# Patient Record
Sex: Male | Born: 1950
Health system: Southern US, Community
[De-identification: ages and names within clinical notes are randomized; demographics above are authoritative.]

## PROBLEM LIST (undated history)

## (undated) DIAGNOSIS — I714 Abdominal aortic aneurysm, without rupture, unspecified: Secondary | ICD-10-CM

## (undated) DIAGNOSIS — I10 Essential (primary) hypertension: Secondary | ICD-10-CM

## (undated) DIAGNOSIS — E785 Hyperlipidemia, unspecified: Secondary | ICD-10-CM

## (undated) DIAGNOSIS — M51369 Other intervertebral disc degeneration, lumbar region without mention of lumbar back pain or lower extremity pain: Secondary | ICD-10-CM

## (undated) DIAGNOSIS — M5136 Other intervertebral disc degeneration, lumbar region: Secondary | ICD-10-CM

## (undated) HISTORY — DX: Other intervertebral disc degeneration, lumbar region without mention of lumbar back pain or lower extremity pain: M51.369

## (undated) HISTORY — DX: Essential (primary) hypertension: I10

## (undated) HISTORY — DX: Abdominal aortic aneurysm, without rupture, unspecified: I71.40

## (undated) HISTORY — DX: Hyperlipidemia, unspecified: E78.5

## (undated) HISTORY — DX: Other intervertebral disc degeneration, lumbar region: M51.36

## (undated) HISTORY — PX: DESCENDING AORTIC ANEURYSM REPAIR W/ STENT: SHX1456

## (undated) HISTORY — DX: Abdominal aortic aneurysm, without rupture: I71.4

---

## 2018-07-21 DIAGNOSIS — M5136 Other intervertebral disc degeneration, lumbar region: Secondary | ICD-10-CM | POA: Insufficient documentation

## 2018-07-21 DIAGNOSIS — M51369 Other intervertebral disc degeneration, lumbar region without mention of lumbar back pain or lower extremity pain: Secondary | ICD-10-CM | POA: Insufficient documentation

## 2018-07-21 DIAGNOSIS — N183 Chronic kidney disease, stage 3 unspecified: Secondary | ICD-10-CM | POA: Insufficient documentation

## 2018-07-21 DIAGNOSIS — Z9889 Other specified postprocedural states: Secondary | ICD-10-CM | POA: Insufficient documentation

## 2018-07-28 ENCOUNTER — Encounter (INDEPENDENT_AMBULATORY_CARE_PROVIDER_SITE_OTHER): Payer: Self-pay | Admitting: Vascular Surgery

## 2018-07-28 ENCOUNTER — Ambulatory Visit (INDEPENDENT_AMBULATORY_CARE_PROVIDER_SITE_OTHER): Payer: Medicare Other | Admitting: Vascular Surgery

## 2018-07-28 ENCOUNTER — Other Ambulatory Visit: Payer: Self-pay

## 2018-07-28 VITALS — BP 125/77 | HR 57 | Resp 10 | Ht 73.0 in | Wt 221.0 lb

## 2018-07-28 DIAGNOSIS — I10 Essential (primary) hypertension: Secondary | ICD-10-CM | POA: Diagnosis not present

## 2018-07-28 DIAGNOSIS — E785 Hyperlipidemia, unspecified: Secondary | ICD-10-CM | POA: Diagnosis not present

## 2018-07-28 DIAGNOSIS — I714 Abdominal aortic aneurysm, without rupture, unspecified: Secondary | ICD-10-CM

## 2018-07-28 DIAGNOSIS — I724 Aneurysm of artery of lower extremity: Secondary | ICD-10-CM | POA: Diagnosis not present

## 2018-07-28 DIAGNOSIS — Z87891 Personal history of nicotine dependence: Secondary | ICD-10-CM

## 2018-07-28 DIAGNOSIS — Z79899 Other long term (current) drug therapy: Secondary | ICD-10-CM

## 2018-07-28 NOTE — Assessment & Plan Note (Signed)
The patient is now 3 years status post an endovascular repair of his abdominal aortic aneurysm.  This has been followed on 36-month intervals so there may have been some sort of endoleak or other issue going on.  He is due for his next check and we will get this scheduled in the next couple of weeks at his convenience.

## 2018-07-28 NOTE — Assessment & Plan Note (Signed)
lipid control important in reducing the progression of atherosclerotic disease. Continue statin therapy  

## 2018-07-28 NOTE — Patient Instructions (Signed)
Abdominal Aortic Aneurysm    An aneurysm is a bulge in one of the blood vessels that carry blood away from the heart (artery). It happens when blood pushes up against a weak or damaged place in the wall of an artery. An abdominal aortic aneurysm happens in the main artery of the body (aorta).  Some aneurysms may not cause problems. If it grows, it can burst or tear, causing bleeding inside the body. This is an emergency. It needs to be treated right away.  What are the causes?  The exact cause of this condition is not known.  What increases the risk?  The following may make you more likely to get this condition:   Being a male who is 60 years of age or older.   Being white (Caucasian).   Using tobacco.   Having a family history of aneurysms.   Having the following conditions:  ? Hardening of the arteries (arteriosclerosis).  ? Inflammation of the walls of an artery (arteritis).  ? Certain genetic conditions.  ? Being very overweight (obesity).  ? An infection in the wall of the aorta (infectious aortitis).  ? High cholesterol.  ? High blood pressure (hypertension).  What are the signs or symptoms?  Symptoms depend on the size of the aneurysm and how fast it is growing. Most grow slowly and do not cause any symptoms. If symptoms do occur, they may include:   Pain in the belly (abdomen), side, or back.   Feeling full after eating only small amounts of food.   Feeling a throbbing lump in the belly.  Symptoms that the aneurysm has burst (ruptured) include:   Sudden, very bad pain in the belly, side, or back.   Feeling sick to your stomach (nauseous).   Throwing up (vomiting).   Feeling light-headed or passing out.  How is this treated?  Treatment for this condition depends on:   The size of the aneurysm.   How fast it is growing.   Your age.   Your risk of having it burst.  If your aneurysm is smaller than 2 inches (5 cm), your doctor may manage it by:   Checking it often to see if it is getting bigger.  You may have an imaging test (ultrasound) to check it every 3-6 months, every year, or every few years.   Giving you medicines to:  ? Control blood pressure.  ? Treat pain.  ? Fight infection.  If your aneurysm is larger than 2 inches (5 cm), you may need surgery to fix it.  Follow these instructions at home:  Lifestyle   Do not use any products that have nicotine or tobacco in them. This includes cigarettes, e-cigarettes, and chewing tobacco. If you need help quitting, ask your doctor.   Get regular exercise. Ask your doctor what types of exercise are best for you.  Eating and drinking   Eat a heart-healthy diet. This includes eating plenty of:  ? Fresh fruits and vegetables.  ? Whole grains.  ? Low-fat (lean) protein.  ? Low-fat dairy products.   Avoid foods that are high in saturated fat and cholesterol. These foods include red meat and some dairy products.   Do not drink alcohol if:  ? Your doctor tells you not to drink.  ? You are pregnant, may be pregnant, or are planning to become pregnant.   If you drink alcohol:  ? Limit how much you use to:   0-1 drink a day for women.     0-2 drinks a day for men.  ? Be aware of how much alcohol is in your drink. In the [place] one drink equals any of these:   One typical bottle of beer (12 oz).   One-half glass of wine (5 oz).   One shot of hard liquor (1 oz).  General instructions   Take over-the-counter and prescription medicines only as told by your doctor.   Keep your blood pressure within normal limits. Ask your doctor what your blood pressure should be.   Have your blood sugar (glucose) level and cholesterol levels checked regularly. Keep your blood sugar level and cholesterol levels within normal limits.   Avoid heavy lifting and activities that take a lot of effort. Ask your doctor what activities are safe for you.   Keep all follow-up visits as told by your doctor. This is important.  ? Talk to your doctor about regular screenings to see if the  aneurysm is getting bigger.  Contact a doctor if you:   Have pain in your belly, side, or back.   Have a throbbing feeling in your belly.   Have a family history of aneurysms.  Get help right away if you:   Have sudden, bad pain in your belly, side, or back.   Feel sick to your stomach.   Throw up.   Have trouble pooping (constipation).   Have trouble peeing (urinating).   Feel light-headed.   Have a fast heart rate when you stand.   Have sweaty skin that is cold to the touch (clammy).   Have shortness of breath.   Have a fever.  These symptoms may be an emergency. Do not wait to see if the symptoms will go away. Get medical help right away. Call your local emergency services (911 in the [place] Do not drive yourself to the hospital.  Summary   An aneurysm is a bulge in one of the blood vessels that carry blood away from the heart (artery). Some aneurysms may not cause problems.   You may need to have yours checked often. If it grows, it can burst or tear. This causes bleeding inside the body. It needs to be treated right away.   Follow instructions from your doctor about healthy lifestyle changes.   Keep all follow-up visits as told by your doctor. This is important.  This information is not intended to replace advice given to you by your health care provider. Make sure you discuss any questions you have with your health care provider.  Document Released: 07/06/2012 Document Revised: 10/18/2017 Document Reviewed: 10/18/2017  Elsevier Interactive Patient Education  2019 [employer]

## 2018-07-28 NOTE — Assessment & Plan Note (Signed)
blood pressure control important in reducing the progression of atherosclerotic disease. On appropriate oral medications.  

## 2018-07-28 NOTE — Assessment & Plan Note (Signed)
He has an asymptomatic right popliteal aneurysm that was found with a meniscus tear last year.  He does not know the size and I do not have the images or report of the ultrasound to review.  He says he is due to have this checked as it was last checked about 6 months ago.  This will be scheduled in the next couple of weeks at his convenience.  I will see the patient back following this to discuss the results and determine further treatment options.

## 2018-07-28 NOTE — Progress Notes (Signed)
Patient ID: Jeffrey Salas, male   DOB: [birth year], 68 y.o.   MRN: [id number]  Chief Complaint  Patient presents with  . New Patient (Initial Visit)    HPI Jeffrey Salas is a 68 y.o. male.  I am asked to see the patient by Dr. Sabra Heck for evaluation of abdominal aortic aneurysm and popliteal aneurysm.  The patient is 3 years status post endovascular abdominal aortic aneurysm repair at another institution.  He said this went great and he is done well without any aneurysm related symptoms following repair.  He has no current back or abdominal pain.  No signs of peripheral embolization.  This was last checked about 6 months ago and he says they have been checking this on 78-month intervals.  Not clear if there has been any endoleak or other issue. The patient also reports being diagnosed with a popliteal aneurysm last fall.  This was found incidentally during a work-up for a meniscus tear.  He says he has some occasional right knee pain.  No significant lower extremity swelling.  No ulceration or infection.     Past Medical History:  Diagnosis Date  . AAA (abdominal aortic aneurysm) ([place])   . Degenerative disc disease, lumbar   . Hyperlipidemia   . Hypertension    Past Surgical History Abdominal aortic aneurysm repair  Family History No bleeding disorders, clotting disorders, autoimmune diseases or aneurysms  Social History Social History   Tobacco Use  . Smoking status: Former Smoker    Types: Cigars    Last attempt to quit: 07/28/2015    Years since quitting: 3.0  . Smokeless tobacco: Never Used  Substance Use Topics  . Alcohol use: Yes    Alcohol/week: 3.0 standard drinks    Types: 3 Glasses of wine per week  . Drug use: Not Currently     No Known Allergies  Current Outpatient Medications  Medication Sig Dispense Refill  . aspirin 81 MG chewable tablet Chew 81 mg by mouth daily.    Marland Kitchen atorvastatin (LIPITOR) 20 MG tablet Take 20 mg by mouth daily.    . carvedilol (COREG)  25 MG tablet Take 25 mg by mouth 2 (two) times daily with a meal.    . hydrALAZINE (APRESOLINE) 10 MG tablet Take 20 mg by mouth 3 (three) times daily.    Marland Kitchen olmesartan (BENICAR) 20 MG tablet Take 20 mg by mouth daily.     No current facility-administered medications for this visit.       REVIEW OF SYSTEMS (Negative unless checked)  Constitutional: [] Weight loss  [] Fever  [] Chills Cardiac: [] Chest pain   [] Chest pressure   [] Palpitations   [] Shortness of breath when laying flat   [] Shortness of breath at rest   [] Shortness of breath with exertion. Vascular:  [] Pain in legs with walking   [] Pain in legs at rest   [] Pain in legs when laying flat   [] Claudication   [] Pain in feet when walking  [] Pain in feet at rest  [] Pain in feet when laying flat   [] History of DVT   [] Phlebitis   [] Swelling in legs   [] Varicose veins   [] Non-healing ulcers Pulmonary:   [] Uses home oxygen   [] Productive cough   [] Hemoptysis   [] Wheeze  [] COPD   [] Asthma Neurologic:  [] Dizziness  [] Blackouts   [] Seizures   [] History of stroke   [] History of TIA  [] Aphasia   [] Temporary blindness   [] Dysphagia   [] Weakness or numbness in arms   [] Weakness  or numbness in legs Musculoskeletal:  [x] Arthritis   [] Joint swelling   [x] Joint pain   [x] Low back pain Hematologic:  [] Easy bruising  [] Easy bleeding   [] Hypercoagulable state   [] Anemic  [] Hepatitis Gastrointestinal:  [] Blood in stool   [] Vomiting blood  [] Gastroesophageal reflux/heartburn   [] Abdominal pain Genitourinary:  [] Chronic kidney disease   [] Difficult urination  [] Frequent urination  [] Burning with urination   [] Hematuria Skin:  [] Rashes   [] Ulcers   [] Wounds Psychological:  [] History of anxiety   []  History of major depression.    Physical Exam BP 125/77 (BP Location: Left Arm, Patient Position: Sitting, Cuff Size: Large)   Pulse (!) 57   Resp 10   Ht 6\' 1"  (1.854 m)   Wt 221 lb (100.2 kg)   BMI 29.16 kg/m  Gen:  WD/WN, NAD.  Appears younger than stated  age Head: [place]/AT, No temporalis wasting. Ear/Nose/Throat: Hearing grossly intact, nares w/o erythema or drainage, oropharynx w/o Erythema/Exudate Eyes: Conjunctiva clear, sclera non-icteric  Neck: trachea midline.  No JVD.  Pulmonary:  Good air movement, respirations not labored, no use of accessory muscles  Cardiac: RRR, no JVD Vascular:  Vessel Right Left  Radial Palpable Palpable                          DP  2+  2+  PT  2+  1+   Gastrointestinal:. No masses, surgical incisions, or scars. Musculoskeletal: M/S 5/5 throughout.  Extremities without ischemic changes.  No deformity or atrophy.  No appreciable edema. Neurologic: Sensation grossly intact in extremities.  Symmetrical.  Speech is fluent. Motor exam as listed above. Psychiatric: Judgment intact, Mood & affect appropriate for pt's clinical situation. Dermatologic: No rashes or ulcers noted.  No cellulitis or open wounds.    Radiology No results found.  Labs No results found for this or any previous visit (from the past 2160 hour(s)).  Assessment/Plan:  Essential hypertension blood pressure control important in reducing the progression of atherosclerotic disease. On appropriate oral medications.   Hyperlipidemia lipid control important in reducing the progression of atherosclerotic disease. Continue statin therapy   AAA (abdominal aortic aneurysm) without rupture ([place]) The patient is now 3 years status post an endovascular repair of his abdominal aortic aneurysm.  This has been followed on 36-month intervals so there may have been some sort of endoleak or other issue going on.  He is due for his next check and we will get this scheduled in the next couple of weeks at his convenience.  Popliteal artery aneurysm [facility]) He has an asymptomatic right popliteal aneurysm that was found with a meniscus tear last year.  He does not know the size and I do not have the images or report of the ultrasound to review.  He says he  is due to have this checked as it was last checked about 6 months ago.  This will be scheduled in the next couple of weeks at his convenience.  I will see the patient back following this to discuss the results and determine further treatment options.      Leotis Pain 07/28/2018, 9:30 AM   This note was created with Dragon medical transcription system.  Any errors from dictation are unintentional.

## 2018-08-11 ENCOUNTER — Encounter (INDEPENDENT_AMBULATORY_CARE_PROVIDER_SITE_OTHER): Payer: Self-pay | Admitting: Vascular Surgery

## 2018-08-11 ENCOUNTER — Ambulatory Visit (INDEPENDENT_AMBULATORY_CARE_PROVIDER_SITE_OTHER): Payer: Medicare Other

## 2018-08-11 ENCOUNTER — Other Ambulatory Visit: Payer: Self-pay

## 2018-08-11 ENCOUNTER — Ambulatory Visit (INDEPENDENT_AMBULATORY_CARE_PROVIDER_SITE_OTHER): Payer: Medicare Other | Admitting: Vascular Surgery

## 2018-08-11 VITALS — BP 115/75 | HR 55 | Resp 10 | Ht 72.5 in | Wt 220.0 lb

## 2018-08-11 DIAGNOSIS — I714 Abdominal aortic aneurysm, without rupture, unspecified: Secondary | ICD-10-CM

## 2018-08-11 DIAGNOSIS — I724 Aneurysm of artery of lower extremity: Secondary | ICD-10-CM | POA: Diagnosis not present

## 2018-08-11 DIAGNOSIS — I10 Essential (primary) hypertension: Secondary | ICD-10-CM | POA: Diagnosis not present

## 2018-08-11 DIAGNOSIS — E785 Hyperlipidemia, unspecified: Secondary | ICD-10-CM | POA: Diagnosis not present

## 2018-08-11 DIAGNOSIS — Z79899 Other long term (current) drug therapy: Secondary | ICD-10-CM

## 2018-08-11 DIAGNOSIS — Z87891 Personal history of nicotine dependence: Secondary | ICD-10-CM

## 2018-08-11 NOTE — Progress Notes (Signed)
MRN : [id number]  Jeffrey Salas is a 68 y.o. (1950/06/19) male who presents with chief complaint of  Chief Complaint  Patient presents with  . Follow-up  .  History of Present Illness: Patient returns today in follow up of his AAA and popliteal aneurysms.  He is about 3 years status post endovascular repair of his aneurysm at another institution.  He has no current aneurysm related symptoms. Specifically, the patient denies new back or abdominal pain, or signs of peripheral embolization.  Duplex today shows a patent stent graft with no evidence of endoleak.  The aneurysm sac size is 5.5 cm in maximal diameter. He is also followed today for his popliteal aneurysm.  Occasional right leg pain but nothing severe.  No signs of peripheral embolization. His right popliteal aneurysm measures 1.9 cm in maximal diameter today.  Previously, it was 1.8 cm last year.  His left popliteal artery is generous at 0.85 cm but not technically aneurysmal.       Past Medical History:  Diagnosis Date  . AAA (abdominal aortic aneurysm) ([place])   . Degenerative disc disease, lumbar   . Hyperlipidemia   . Hypertension    Past Surgical History Abdominal aortic aneurysm repair  Family History No bleeding disorders, clotting disorders, autoimmune diseases or aneurysms  Social History Social History        Tobacco Use  . Smoking status: Former Smoker    Types: Cigars    Last attempt to quit: 07/28/2015    Years since quitting: 3.0  . Smokeless tobacco: Never Used  Substance Use Topics  . Alcohol use: Yes    Alcohol/week: 3.0 standard drinks    Types: 3 Glasses of wine per week  . Drug use: Not Currently     No Known Allergies        Current Outpatient Medications  Medication Sig Dispense Refill  . aspirin 81 MG chewable tablet Chew 81 mg by mouth daily.    Marland Kitchen atorvastatin (LIPITOR) 20 MG tablet Take 20 mg by mouth daily.    . carvedilol (COREG) 25 MG tablet Take 25 mg by  mouth 2 (two) times daily with a meal.    . hydrALAZINE (APRESOLINE) 10 MG tablet Take 20 mg by mouth 3 (three) times daily.    Marland Kitchen olmesartan (BENICAR) 20 MG tablet Take 20 mg by mouth daily.     No current facility-administered medications for this visit.       REVIEW OF SYSTEMS (Negative unless checked)  Constitutional: [] ?Weight loss  [] ?Fever  [] ?Chills Cardiac: [] ?Chest pain   [] ?Chest pressure   [] ?Palpitations   [] ?Shortness of breath when laying flat   [] ?Shortness of breath at rest   [] ?Shortness of breath with exertion. Vascular:  [] ?Pain in legs with walking   [] ?Pain in legs at rest   [] ?Pain in legs when laying flat   [] ?Claudication   [] ?Pain in feet when walking  [] ?Pain in feet at rest  [] ?Pain in feet when laying flat   [] ?History of DVT   [] ?Phlebitis   [] ?Swelling in legs   [] ?Varicose veins   [] ?Non-healing ulcers Pulmonary:   [] ?Uses home oxygen   [] ?Productive cough   [] ?Hemoptysis   [] ?Wheeze  [] ?COPD   [] ?Asthma Neurologic:  [] ?Dizziness  [] ?Blackouts   [] ?Seizures   [] ?History of stroke   [] ?History of TIA  [] ?Aphasia   [] ?Temporary blindness   [] ?Dysphagia   [] ?Weakness or numbness in arms   [] ?Weakness or numbness in legs Musculoskeletal:  [x] ?Arthritis   [] ?  Joint swelling   [x] ?Joint pain   [x] ?Low back pain Hematologic:  [] ?Easy bruising  [] ?Easy bleeding   [] ?Hypercoagulable state   [] ?Anemic  [] ?Hepatitis Gastrointestinal:  [] ?Blood in stool   [] ?Vomiting blood  [] ?Gastroesophageal reflux/heartburn   [] ?Abdominal pain Genitourinary:  [] ?Chronic kidney disease   [] ?Difficult urination  [] ?Frequent urination  [] ?Burning with urination   [] ?Hematuria Skin:  [] ?Rashes   [] ?Ulcers   [] ?Wounds Psychological:  [] ?History of anxiety   [] ? History of major depression.    Physical Examination  BP 115/75 (BP Location: Left Arm, Patient Position: Sitting, Cuff Size: Large)   Pulse (!) 55   Resp 10   Ht 6' 0.5" (1.842 m)   Wt 220 lb (99.8 kg)   BMI 29.43  kg/m  Gen:  WD/WN, NAD Head: [place]/AT, No temporalis wasting. Ear/Nose/Throat: Hearing grossly intact, nares w/o erythema or drainage Eyes: Conjunctiva clear. Sclera non-icteric Neck: Supple.  Trachea midline Pulmonary:  Good air movement, no use of accessory muscles.  Cardiac: RRR, no JVD Vascular:  Vessel Right Left  Radial Palpable Palpable                      Popliteal  enlarged  enlarged  PT Palpable Palpable  DP Palpable Palpable   Gastrointestinal: soft, non-tender/non-distended. No guarding/reflex.  Musculoskeletal: M/S 5/5 throughout.  No deformity or atrophy.  Neurologic: Sensation grossly intact in extremities.  Symmetrical.  Speech is fluent.  Psychiatric: Judgment intact, Mood & affect appropriate for pt's clinical situation. Dermatologic: No rashes or ulcers noted.  No cellulitis or open wounds.       Labs No results found for this or any previous visit (from the past 2160 hour(s)).  Radiology No results found.  Assessment/Plan Essential hypertension blood pressure control important in reducing the progression of atherosclerotic disease. On appropriate oral medications.   Hyperlipidemia lipid control important in reducing the progression of atherosclerotic disease. Continue statin therapy  AAA (abdominal aortic aneurysm) without rupture ([facility]) Duplex today shows a patent stent graft with no evidence of endoleak.  The aneurysm sac size is 5.5 cm in maximal diameter. At this point, with no endoleak I think going to an annual follow-up would be reasonable.  Popliteal artery aneurysm ([facility]) His right popliteal aneurysm measures 1.9 cm in maximal diameter today.  Previously, it was 1.8 cm last year.  His left popliteal artery is generous at 0.85 cm but not technically aneurysmal.  I think shortening his follow-up to 6 months at this point would be reasonable.  We discussed that aneurysms over 2 cm have a higher risk of thrombosis and that is usually where we  consider repair.    Leotis Pain, MD  08/11/2018 9:45 AM    This note was created with Dragon medical transcription system.  Any errors from dictation are purely unintentional

## 2018-08-11 NOTE — Assessment & Plan Note (Signed)
Duplex today shows a patent stent graft with no evidence of endoleak.  The aneurysm sac size is 5.5 cm in maximal diameter. At this point, with no endoleak I think going to an annual follow-up would be reasonable.

## 2018-08-11 NOTE — Patient Instructions (Signed)
Abdominal Aortic Aneurysm  An aneurysm is a bulge in an artery. It happens when blood pushes against a weakened or damaged artery wall. An abdominal aortic aneurysm (AAA) is an aneurysm that occurs in the lower part of the aorta, which is the main artery of the body. The aorta supplies blood from the heart to the rest of the body. Some aneurysms may not cause symptoms. However, an AAA can cause two serious problems:  It can enlarge and burst (rupture).  It can cause blood to flow between the layers of the wall of the aorta through a tear (aortic dissection). Both of these problems are medical emergencies. They can cause bleeding inside the body. If they are not diagnosed and treated right away, they can be life-threatening. What are the causes? The exact cause of this condition is not known. What increases the risk? The following factors may make you more likely to develop this condition:  Being a male 60 years of age or older.  Being Caucasian.  Using tobacco or having a history of tobacco use.  Having a family history of aneurysms.  Having any of the following conditions: ? Hardening of the arteries (arteriosclerosis). ? Inflammation of the walls of an artery (arteritis). ? Certain genetic conditions. ? Obesity. ? An infection in the wall of the aorta (infectious aortitis) caused by bacteria. ? High cholesterol. ? High blood pressure (hypertension). What are the signs or symptoms? Symptoms of this condition vary depending on the size of the aneurysm and how fast it is growing. Most aneurysms grow slowly and do not cause any symptoms. When symptoms do occur, they may include:  Pain in the abdomen, side, or lower back.  Feeling full after eating only small amounts of food.  Feeling a pulsating lump in the abdomen. Symptoms that the aneurysm has ruptured include:  A sudden onset of severe pain in the abdomen, side, or back.  Nausea or vomiting.  Feeling light-headed or  passing out. How is this diagnosed? This condition may be diagnosed with:  A physical exam to check for throbbing and to listen to blood flow in your abdomen.  Tests, such as: ? Ultrasound. ? X-rays. ? CT scan. ? MRI. ? Tests to check your arteries for damage or blockage (angiogram). Because most unruptured AAAs cause no symptoms, they are often found during exams for other conditions. How is this treated? Treatment for this condition depends on:  The size of the aneurysm.  How fast the aneurysm is growing.  Your age.  Risk factors for rupture. If your aneurysm is smaller than 2 inches (5 cm), your health care provider may manage it by:  Checking (monitoring) it regularly to see if it is getting bigger. Depending on the size of the aneurysm, how fast it is growing, and your other risk factors, you may have an ultrasound to monitor it every 3-6 months, every year, or every few years.  Giving you medicines to control blood pressure, treat pain, or fight infection. If your aneurysm is larger than 2 inches (5 cm), your health care provider may do surgery to repair it. Follow these instructions at home: Lifestyle  Do not use any products that contain nicotine or tobacco, such as cigarettes, e-cigarettes, and chewing tobacco. If you need help quitting, ask your health care provider.  Stay physically active and exercise regularly. Talk with your health care provider about how often you should exercise and which types of exercise are safe for you. Eating and drinking    Eat a heart-healthy diet. This includes plenty of fresh fruits and vegetables, whole grains, low-fat (lean) protein, and low-fat dairy products.  Avoid foods that are high in saturated fat and cholesterol, such as red meat and some dairy products.  Do not drink alcohol if: ? Your health care provider tells you not to drink. ? You are pregnant, may be pregnant, or are planning to become pregnant.  If you drink  alcohol: ? Limit how much you use to:  0-1 drink a day for women.  0-2 drinks a day for men. ? Be aware of how much alcohol is in your drink. In the [place] one drink equals one typical bottle of beer (12 oz), one-half glass of wine (5 oz), or one shot of hard liquor (1 oz). General instructions  Take over-the-counter and prescription medicines only as told by your health care provider.  Keep your blood pressure within a normal range. Check it regularly, and ask your health care provider what your target blood pressure should be.  Have your blood sugar (glucose) level and cholesterol levels checked regularly. Follow your health care provider's instructions on how to keep levels within normal limits.  Avoid heavy lifting and activities that take a lot of effort (are strenuous). Ask your health care provider what activities are safe for you.  Keep all follow-up visits as told by your health care provider. This is important. Contact a health care provider if you:  Have pain in your abdomen, side, or back.  Have a throbbing feeling in your abdomen. Get help right away if you:  Have sudden, severe pain in your abdomen, side, or back.  Experience nausea or vomiting.  Have constipation or problems urinating.  Feel light-headed.  Have a rapid heart rate when you stand.  Have sweaty, clammy skin.  Have shortness of breath.  Have a fever. These symptoms may represent a serious problem that is an emergency. Do not wait to see if the symptoms will go away. Get medical help right away. Call your local emergency services (911 in the [place] Do not drive yourself to the hospital. Summary  An aneurysm is a bulge in an artery. It happens when blood pushes against a weakened or damaged artery wall.  Being older, male, Caucasian, having a history of tobacco use, and a family history of aneurysms can increase the risk.  These problems can cause bleeding inside the body and can be  life-threatening. Get medical help right away. This information is not intended to replace advice given to you by your health care provider. Make sure you discuss any questions you have with your health care provider. Document Released: 12/19/2004 Document Revised: 10/18/2017 Document Reviewed: 10/18/2017 Elsevier Interactive Patient Education  2019 [employer].

## 2018-08-11 NOTE — Assessment & Plan Note (Signed)
His right popliteal aneurysm measures 1.9 cm in maximal diameter today.  Previously, it was 1.8 cm last year.  His left popliteal artery is generous at 0.85 cm but not technically aneurysmal.  I think shortening his follow-up to 6 months at this point would be reasonable.  We discussed that aneurysms over 2 cm have a higher risk of thrombosis and that is usually where we consider repair.

## 2018-10-23 ENCOUNTER — Other Ambulatory Visit: Admission: RE | Admit: 2018-10-23 | Payer: Medicare Other | Source: Ambulatory Visit

## 2018-10-28 ENCOUNTER — Ambulatory Visit: Admit: 2018-10-28 | Payer: Medicare Other | Admitting: Internal Medicine

## 2018-10-28 SURGERY — COLONOSCOPY WITH PROPOFOL
Anesthesia: General

## 2019-01-18 DIAGNOSIS — Z0000 Encounter for general adult medical examination without abnormal findings: Secondary | ICD-10-CM | POA: Insufficient documentation

## 2019-02-12 ENCOUNTER — Encounter (INDEPENDENT_AMBULATORY_CARE_PROVIDER_SITE_OTHER): Payer: Medicare Other

## 2019-02-12 ENCOUNTER — Ambulatory Visit (INDEPENDENT_AMBULATORY_CARE_PROVIDER_SITE_OTHER): Payer: Medicare Other | Admitting: Vascular Surgery

## 2019-02-26 ENCOUNTER — Encounter (INDEPENDENT_AMBULATORY_CARE_PROVIDER_SITE_OTHER): Payer: Self-pay | Admitting: Vascular Surgery

## 2019-02-26 ENCOUNTER — Other Ambulatory Visit: Payer: Self-pay

## 2019-02-26 ENCOUNTER — Encounter (INDEPENDENT_AMBULATORY_CARE_PROVIDER_SITE_OTHER): Payer: Self-pay

## 2019-02-26 ENCOUNTER — Ambulatory Visit (INDEPENDENT_AMBULATORY_CARE_PROVIDER_SITE_OTHER): Payer: Medicare Other | Admitting: Vascular Surgery

## 2019-02-26 ENCOUNTER — Ambulatory Visit (INDEPENDENT_AMBULATORY_CARE_PROVIDER_SITE_OTHER): Payer: Medicare Other

## 2019-02-26 VITALS — Resp 16 | Wt 218.0 lb

## 2019-02-26 DIAGNOSIS — I714 Abdominal aortic aneurysm, without rupture, unspecified: Secondary | ICD-10-CM

## 2019-02-26 DIAGNOSIS — I724 Aneurysm of artery of lower extremity: Secondary | ICD-10-CM

## 2019-02-26 DIAGNOSIS — I10 Essential (primary) hypertension: Secondary | ICD-10-CM | POA: Diagnosis not present

## 2019-02-26 DIAGNOSIS — E785 Hyperlipidemia, unspecified: Secondary | ICD-10-CM

## 2019-02-26 DIAGNOSIS — N183 Chronic kidney disease, stage 3 unspecified: Secondary | ICD-10-CM

## 2019-02-26 NOTE — Progress Notes (Signed)
MRN : [id number]  Jeffrey Salas is a 68 y.o. ([birth year]) male who presents with chief complaint of  Chief Complaint  Patient presents with   Follow-up    u/s follow up  .  History of Present Illness: Patient returns today in follow up of his abdominal aortic aneurysm and popliteal aneurysms.  He is doing well today without new complaints.  He is not having any aneurysm related symptoms. Specifically, the patient denies new back or abdominal pain, or signs of peripheral embolization.  He has some occasional mild lower extremity swelling but nothing significant.  His duplex today does show continued growth of his popliteal aneurysms bilaterally.  The right popliteal aneurysm now measures 2.2 cm in maximal diameter.  Previously this was 1.86.  The left popliteal aneurysm measures 1.1 cm in maximal diameter.  Previously this was 0.9 cm.  Current Outpatient Medications  Medication Sig Dispense Refill   aspirin 81 MG chewable tablet Chew 81 mg by mouth daily.     atorvastatin (LIPITOR) 20 MG tablet Take 20 mg by mouth daily.     carvedilol (COREG) 25 MG tablet Take 25 mg by mouth 2 (two) times daily with a meal.     hydrALAZINE (APRESOLINE) 10 MG tablet Take 20 mg by mouth 3 (three) times daily.     olmesartan (BENICAR) 20 MG tablet Take 20 mg by mouth daily.     vitamin B-12 (CYANOCOBALAMIN) 1000 MCG tablet Take 1,000 mcg by mouth daily.     No current facility-administered medications for this visit.     Past Medical History:  Diagnosis Date   AAA (abdominal aortic aneurysm) (HCC)    Degenerative disc disease, lumbar    Hyperlipidemia    Hypertension     Past Surgical History Abdominal aortic aneurysm repair  Family History No bleeding disorders, clotting disorders, autoimmune diseases or aneurysms  Social History Social History        Tobacco Use   Smoking status: Former Smoker    Types: Cigars    Last attempt to quit: 07/28/2015    Years since  quitting: 3.0   Smokeless tobacco: Never Used  Substance Use Topics   Alcohol use: Yes    Alcohol/week: 3.0 standard drinks    Types: 3 Glasses of wine per week   Drug use: Not Currently     No Known Allergies            Dispense Refill                                 No current facility-administered medications for this visit.      REVIEW OF SYSTEMS(Negative unless checked)  Constitutional: [] ??Weight loss[] ??Fever[] ??Chills Cardiac:[] ??Chest pain[] ??Chest pressure[] ??Palpitations [] ??Shortness of breath when laying flat [] ??Shortness of breath at rest [] ??Shortness of breath with exertion. Vascular: [] ??Pain in legs with walking[] ??Pain in legsat rest[] ??Pain in legs when laying flat [] ??Claudication [] ??Pain in feet when walking [] ??Pain in feet at rest [] ??Pain in feet when laying flat [] ??History of DVT [] ??Phlebitis [] ??Swelling in legs [] ??Varicose veins [] ??Non-healing ulcers Pulmonary: [] ??Uses home oxygen [] ??Productive cough[] ??Hemoptysis [] ??Wheeze [] ??COPD [] ??Asthma Neurologic: [] ??Dizziness [] ??Blackouts [] ??Seizures [] ??History of stroke [] ??History of TIA[] ??Aphasia [] ??Temporary blindness[] ??Dysphagia [] ??Weaknessor numbness in arms [] ??Weakness or numbnessin legs Musculoskeletal: [x] ??Arthritis [] ??Joint swelling [x] ??Joint pain [x] ??Low back pain Hematologic:[] ??Easy bruising[] ??Easy bleeding [] ??Hypercoagulable state [] ??Anemic [] ??Hepatitis Gastrointestinal:[] ??Blood in stool[] ??Vomiting blood[] ??Gastroesophageal reflux/heartburn[] ??Abdominal pain Genitourinary: [] ??Chronic kidney disease [] ??Difficulturination [] ??Frequenturination [] ??Burning with urination[] ??Hematuria Skin: [] ??Rashes [] ??Ulcers [] ??Wounds Psychological: [] ??History  of anxiety[] ??History of major depression.     Physical  Examination  Resp 16    Wt 218 lb (98.9 kg)    BMI 29.16 kg/m  Gen:  WD/WN, NAD.  Appears younger than stated age Head: [place]/AT, No temporalis wasting. Ear/Nose/Throat: Hearing grossly intact, nares w/o erythema or drainage Eyes: Conjunctiva clear. Sclera non-icteric Neck: Supple.  Trachea midline Pulmonary:  Good air movement, no use of accessory muscles.  Cardiac: RRR, no JVD Vascular:  Vessel Right Left  Radial Palpable Palpable                          PT Palpable Palpable  DP Palpable Palpable   Gastrointestinal: soft, non-tender/non-distended. No guarding/reflex.  Musculoskeletal: M/S 5/5 throughout.  No deformity or atrophy.  Trace lower extremity edema. Neurologic: Sensation grossly intact in extremities.  Symmetrical.  Speech is fluent.  Psychiatric: Judgment intact, Mood & affect appropriate for pt's clinical situation. Dermatologic: No rashes or ulcers noted.  No cellulitis or open wounds.       Labs No results found for this or any previous visit (from the past 2160 hour(s)).  Radiology Vas [place] Lower Extremity Arterial Duplex  Result Date: 02/26/2019 LOWER EXTREMITY ARTERIAL DUPLEX STUDY  Current ABI: N?A Comparison Study: 08/11/2018 Performing Technologist: Charlane Ferretti RT (R)(VS)  Examination Guidelines: A complete evaluation includes B-mode imaging, spectral Doppler, color Doppler, and power Doppler as needed of all accessible portions of each vessel. Bilateral testing is considered an integral part of a complete examination. Limited examinations for reoccurring indications may be performed as noted.  +---------------+-------+-----------+---------+--------+-----+--------+  Right Popliteal AP (cm) Transv (cm) Waveform  Stenosis Shape Comments  +---------------+-------+-----------+---------+--------+-----+--------+  Proximal        1.86    2.17        triphasic                          +---------------+-------+-----------+---------+--------+-----+--------+  Mid              1.51                triphasic                          +---------------+-------+-----------+---------+--------+-----+--------+ +--------------+-------+-----------+---------+--------+-----+--------+  Left Popliteal AP (cm) Transv (cm) Waveform  Stenosis Shape Comments  +--------------+-------+-----------+---------+--------+-----+--------+  Proximal       1.03    1.08        triphasic                          +--------------+-------+-----------+---------+--------+-----+--------+  Distal         0.84    0.98        triphasic                          +--------------+-------+-----------+---------+--------+-----+--------+  Summary: Right: Right popliteal aneurysm has slightly increased from the previous exam on 08/11/2018 from 1.9cm to 2.17 in the transverse view. Left: Left popliteal artery has increased from .85cm to 1.08cm in transverse as compared to the previous exam on 08/11/2018.  See table(s) above for measurements and observations. Electronically signed by Leotis Pain MD on 02/26/2019 at 10:47:51 AM.    Final     Assessment/Plan Essential hypertension blood pressure control important in reducing the progression of atherosclerotic disease. On appropriate oral medications.   Hyperlipidemia lipid  control important in reducing the progression of atherosclerotic disease. Continue statin therapy  AAA (abdominal aortic aneurysm) without rupture (HCC) Duplex earlier this year shows a patent stent graft with no evidence of endoleak.  The aneurysm sac size is 5.5 cm in maximal diameter. At this point, with no endoleak I think going to an annual follow-up would be reasonable. Due to be checked in about 6 months  Popliteal artery aneurysm ([place]) His duplex today does show continued growth of his popliteal aneurysms bilaterally.  The right popliteal aneurysm now measures 2.2 cm in maximal diameter.  Previously this was 1.86.  The left popliteal aneurysm measures 1.1 cm in maximal diameter.  Previously  this was 0.9 cm. This is a worrisome finding and at this point with a greater than 2 cm popliteal aneurysm and a relatively healthy individual under the age of 18, I would recommend consideration for repair.  Going to order a CT angiogram of his renal function will allow this as I think this will help our surgical planning.  Discussed that the vast majority of these are fixed with a stent graft.  His previous abdominal aortic aneurysm repair may complicate the repair and he may require an antegrade access with a cutdown.  This would also help give [place] an idea of the sheath size that can be used for this repair.  We will see him back in a few weeks at his request with a CT scan and follow-up.   CKD (chronic kidney disease) stage 3, GFR 30-59 ml/min Will need to check his creatinine with his CT scan.  If his renal function precludes CT scan, we can proceed with repair without it and limit contrast but it complicates stent graft planning.    Leotis Pain, MD  02/26/2019 10:50 AM    This note was created with Dragon medical transcription system.  Any errors from dictation are purely unintentional

## 2019-02-26 NOTE — Assessment & Plan Note (Signed)
Will need to check his creatinine with his CT scan.  If his renal function precludes CT scan, we can proceed with repair without it and limit contrast but it complicates stent graft planning.

## 2019-02-26 NOTE — Assessment & Plan Note (Signed)
His duplex today does show continued growth of his popliteal aneurysms bilaterally.  The right popliteal aneurysm now measures 2.2 cm in maximal diameter.  Previously this was 1.86.  The left popliteal aneurysm measures 1.1 cm in maximal diameter.  Previously this was 0.9 cm. This is a worrisome finding and at this point with a greater than 2 cm popliteal aneurysm and a relatively healthy individual under the age of 80, I would recommend consideration for repair.  Going to order a CT angiogram of his renal function will allow this as I think this will help our surgical planning.  Discussed that the vast majority of these are fixed with a stent graft.  His previous abdominal aortic aneurysm repair may complicate the repair and he may require an antegrade access with a cutdown.  This would also help give [place] an idea of the sheath size that can be used for this repair.  We will see him back in a few weeks at his request with a CT scan and follow-up.

## 2019-04-01 ENCOUNTER — Other Ambulatory Visit: Payer: Self-pay

## 2019-04-01 ENCOUNTER — Ambulatory Visit
Admission: RE | Admit: 2019-04-01 | Discharge: 2019-04-01 | Disposition: A | Discharge status: Home / Self Care | Payer: PPO | Source: Ambulatory Visit | Attending: Vascular Surgery | Admitting: Vascular Surgery

## 2019-04-01 DIAGNOSIS — I724 Aneurysm of artery of lower extremity: Secondary | ICD-10-CM | POA: Diagnosis not present

## 2019-04-01 LAB — POCT I-STAT CREATININE: Creatinine, Ser: 1.8 mg/dL — ABNORMAL HIGH (ref 0.61–1.24)

## 2019-04-01 IMAGING — CT CT ANGIO AOBIFEM WO/W CM
2 of 10 series · 11 of 46 positions shown, 15 images · IV contrast (APPLIED)
Comparison: None.

CLINICAL DATA: Popliteal artery aneurysm

EXAM:
CT ANGIOGRAPHY OF ABDOMINAL AORTA WITH ILIOFEMORAL RUNOFF
TECHNIQUE: Multidetector CT imaging of the abdomen, pelvis and lower
extremities was performed using the standard protocol during bolus
administration of intravenous contrast. Multiplanar CT image
reconstructions and MIPs were obtained to evaluate the vascular
anatomy.
CONTRAST:  100mL OMNIPAQUE IOHEXOL 350 MG/ML SOLN

[Series 9: axial arterial lower · axial · arterial · 0.88mm/px · z∈[-1258,-428]mm · 10 of 334 slices shown, 13 images]
[im 38/334  soft-tissue]
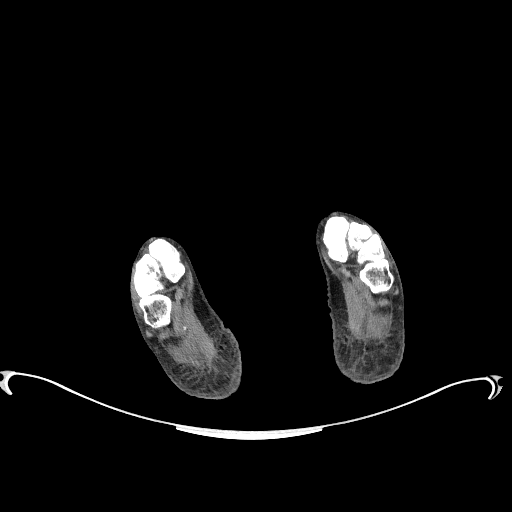
[im 38/334  bone]
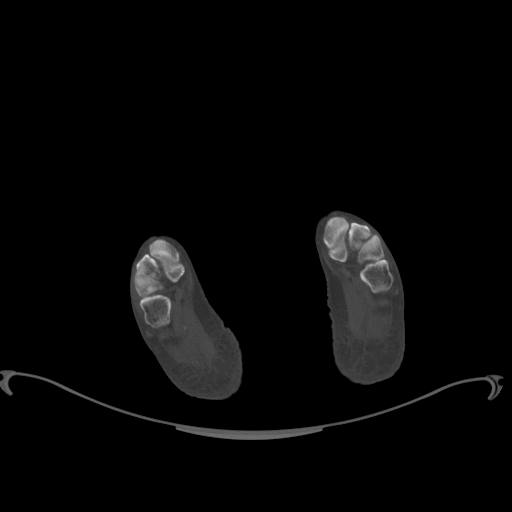
[im 75/334  soft-tissue]
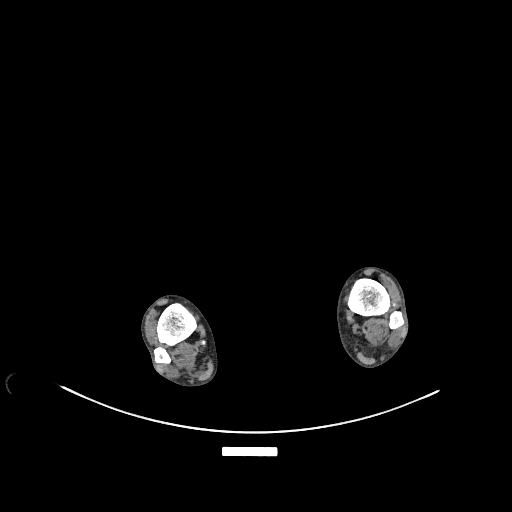
[im 112/334  soft-tissue]
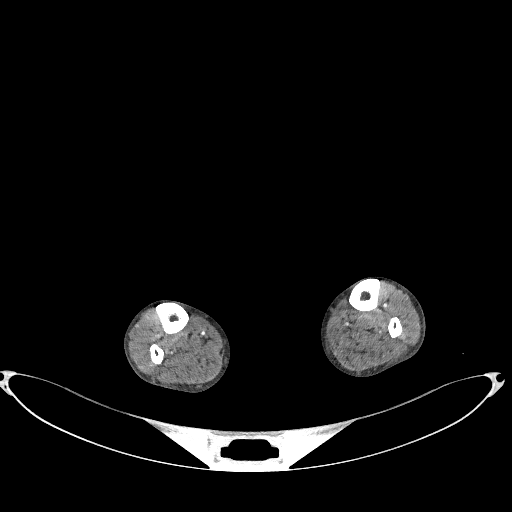
[im 149/334  soft-tissue]
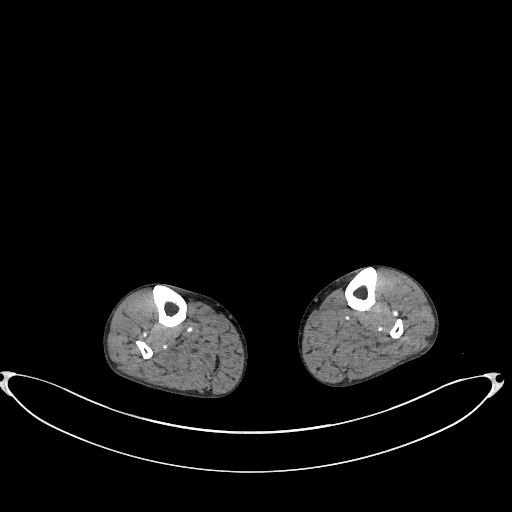
[im 186/334  soft-tissue]
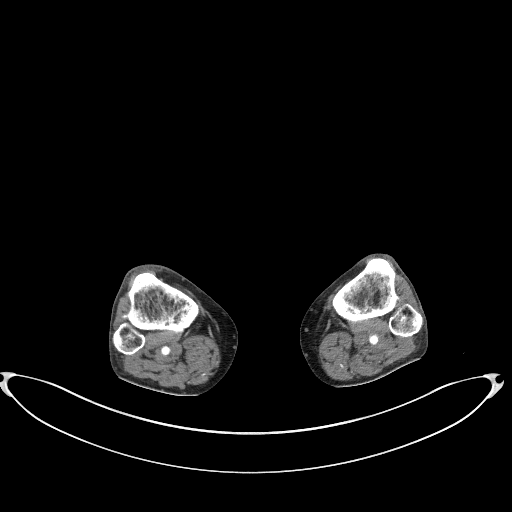
[im 223/334  soft-tissue]
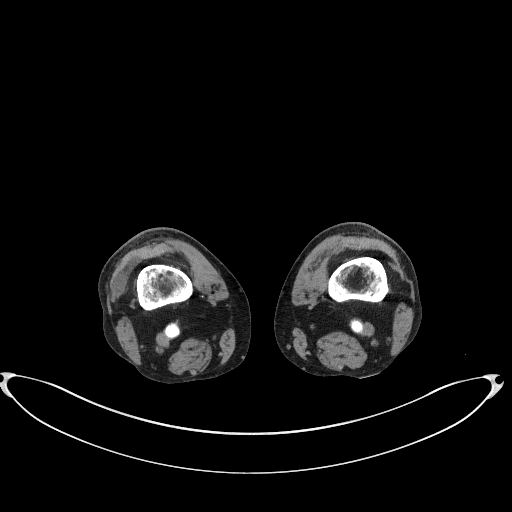
[im 260/334  soft-tissue]
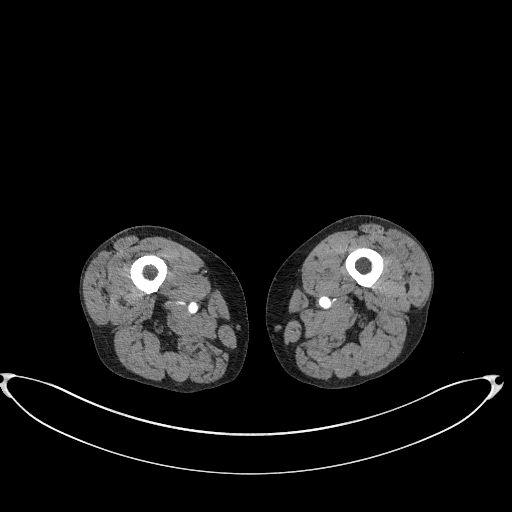
[im 260/334  lung]
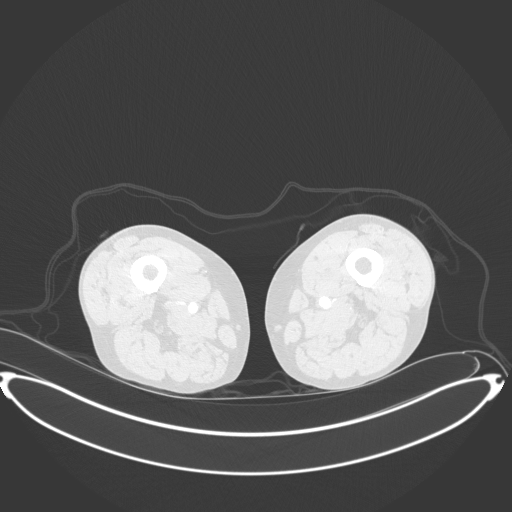
[im 278/334  lung]
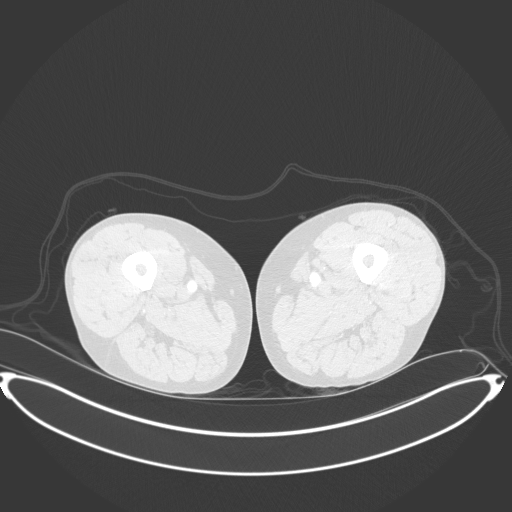
[im 297/334  soft-tissue]
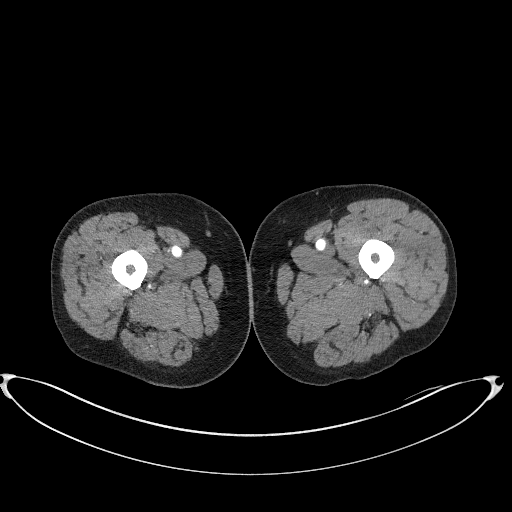
[im 297/334  lung]
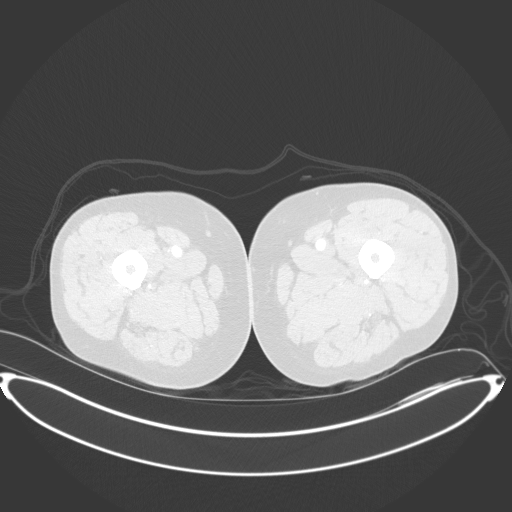
[im 315/334  lung]
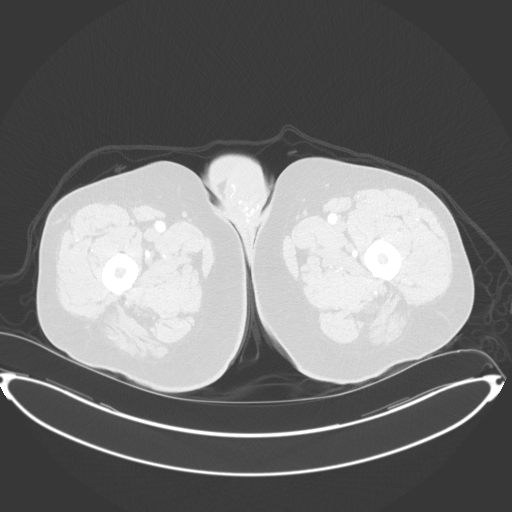

[Series 11: coronal upper · coronal · 0.82mm/px · 1 of 140 slices shown, 2 images]
[im 70/140  soft-tissue]
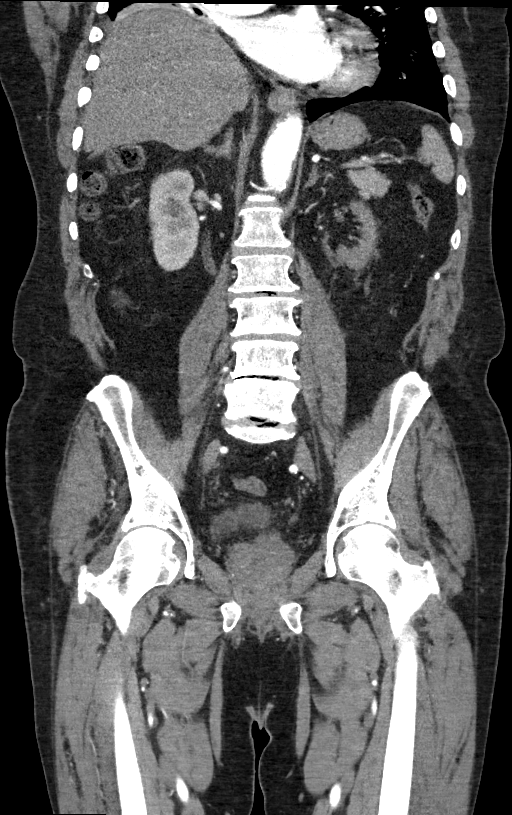
[im 70/140  bone]
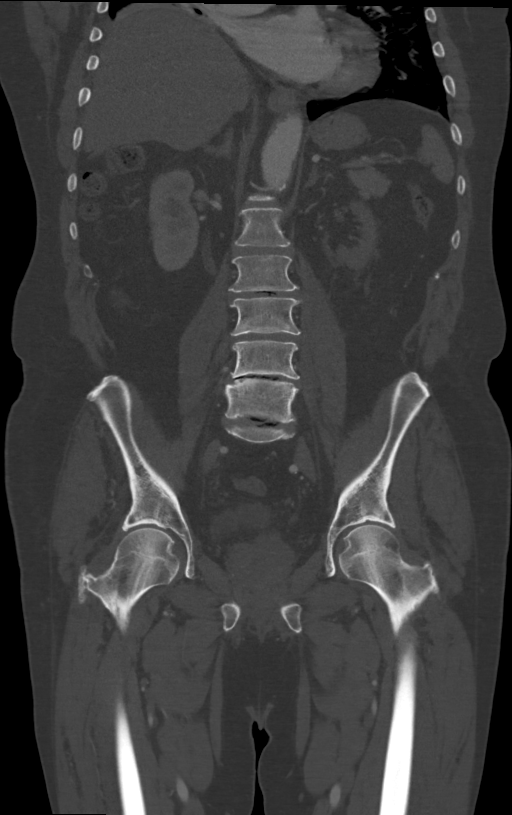

[11 of 46 positions shown; findings below may reference images not displayed]

FINDINGS: VASCULAR

Aorta: Scattered calcified atheromatous plaque and eccentric
nonocclusive mural thrombus in the visualized distal descending
thoracic and suprarenal segments. Patent bifurcated infrarenal stent
graft. Excluded infrarenal fusiform aneurysm involving native aorta
5.2 x 4.7 cm maximum transverse dimensions. No definite endoleak
although sensitivity decreased without pre contrast and delayed
images. No dissection or stenosis.

Celiac: Patent without evidence of aneurysm, dissection, vasculitis
or significant stenosis.

SMA: Patent without evidence of aneurysm, dissection, vasculitis or
significant stenosis.

Renals: Single left, with short-segment ostial high-grade stenosis
or occlusion, more distally atheromatous plaque with long segment
narrowing.

Single right, with early bifurcation. Scattered partially calcified
plaque proximally without definite high-grade stenosis.

IMA: Origin occlusion, reconstituted distally by visceral
collaterals.

RIGHT Lower Extremity

Inflow: Right limb of the stent graft extends to the distal common
iliac, tines incompletely apposed in a fusiform aneurysm which
extends to the common iliac bifurcation, measuring up to 2.5 cm
maximum transverse diameter. There is ectasia of the internal iliac
origin measured up to 1.3 cm diameter. External iliac is
atheromatous without stenosis or aneurysm.

Outflow: Mild atheromatous ectasia of the common femoral without
dissection or stenosis.

Deep femoral branches patent.

Superficial femoral artery patent with mild scattered nonocclusive
plaque

Popliteal artery has bilobed aneurysm above the knee, 2.1 cm maximum
transverse diameter, containing mild atheromatous plaque, without
stenosis. Popliteal artery below the knee is ectatic, atheromatous,
patent.

Runoff: Scattered atheromatous plaque. Anterior tibial artery seen
to the distal calf level. Diminutive peroneal artery seen to the
distal calf level. Posterior tibial is patent across the ankle
common dominant supply to the foot.

LEFT Lower Extremity

Inflow: Left limb of the stent graft extends to the distal common
iliac, tines incompletely apposed, in a fusiform 2.1 cm aneurysm.
Internal and external iliac arteries are mildly atheromatous,
patent.

Outflow: Common femoral atheromatous, patent

Deep femoral artery is patent.

SFA mildly atheromatous, patent

Popliteal artery mildly atheromatous, ectatic, patent

Runoff: Peroneal artery is seen to the distal calf.

Anterior and posterior tibial arteries are opacified to just above
the ankle , distal patency indeterminate due to poor scan timing.

Veins: No obvious venous abnormality within the limitations of this
arterial phase study.

Review of the MIP images confirms the above findings.

NON-VASCULAR

Lower chest: Consolidation/atelectasis at the right lung base. No
pleural or pericardial effusion.

Hepatobiliary: No focal liver abnormality is seen. No gallstones,
gallbladder wall thickening, or biliary dilatation.

Pancreas: Unremarkable. No pancreatic ductal dilatation or
surrounding inflammatory changes.

Spleen: Normal in size without focal abnormality.

Adrenals/Urinary Tract: Adrenal glands unremarkable.

Left renal diffuse parenchymal atrophy and decreased enhancement
probably related to long segment left renal artery stenosis. Left
nephrolithiasis, largest stone 9 mm diameter in the lower pole.

No hydronephrosis. Urinary bladder incompletely distended.

Stomach/Bowel: Stomach is decompressed. The small bowel is
nondistended. Appendix unremarkable. Colon is decompressed,
unremarkable.

Lymphatic: No abdominal or pelvic adenopathy.

Reproductive: Mild prostate enlargement.

Other: No ascites. Left pelvic phlebolith. No free air.

Musculoskeletal: Multilevel lumbar spondylitic change. No fracture
or worrisome bone lesion.
IMPRESSION: 1. Patent bifurcated infrarenal stent graft, with no definite
endoleak; 5.2 cm native sac diameter.
2. Bilateral common iliac artery aneurysms, 2.5 cm on the right and
2.1 cm on the left.
3. Left renal artery ostial high-grade stenosis or occlusion, with
diffuse renal parenchymal atrophy and decreased enhancement.
4. 2.1 cm proximal RIGHT popliteal artery bilobed aneurysm,
primarily posterior tibial runoff.

5. Right lower lobe consolidation/atelectasis.
6. Left nephrolithiasis without hydronephrosis.
7. Additional ancillary findings as enumerated above, including
aortoiliac and femoral-popliteal arterial occlusive disease. Please
note that although the presence of coronary artery calcium documents
the presence of coronary artery disease, the severity of this
disease and any potential stenosis cannot be assessed on this
non-gated CT examination. Assessment for potential risk factor
modification, dietary therapy or pharmacologic therapy may be
warranted, if clinically indicated.

## 2019-04-01 MED ORDER — IOHEXOL 350 MG/ML SOLN
125.0000 mL | Freq: Once | INTRAVENOUS | Status: AC | PRN
  Administered 2019-04-01: 11:00:00 100 mL via INTRAVENOUS

## 2019-04-06 ENCOUNTER — Encounter (INDEPENDENT_AMBULATORY_CARE_PROVIDER_SITE_OTHER): Payer: Self-pay | Admitting: Nurse Practitioner

## 2019-04-06 ENCOUNTER — Other Ambulatory Visit: Payer: Self-pay

## 2019-04-06 ENCOUNTER — Ambulatory Visit (INDEPENDENT_AMBULATORY_CARE_PROVIDER_SITE_OTHER): Payer: PPO | Admitting: Vascular Surgery

## 2019-04-06 VITALS — BP 108/68 | HR 58 | Resp 16 | Wt 224.4 lb

## 2019-04-06 DIAGNOSIS — I714 Abdominal aortic aneurysm, without rupture, unspecified: Secondary | ICD-10-CM

## 2019-04-06 DIAGNOSIS — I724 Aneurysm of artery of lower extremity: Secondary | ICD-10-CM | POA: Diagnosis not present

## 2019-04-06 DIAGNOSIS — E785 Hyperlipidemia, unspecified: Secondary | ICD-10-CM

## 2019-04-06 DIAGNOSIS — I10 Essential (primary) hypertension: Secondary | ICD-10-CM | POA: Diagnosis not present

## 2019-04-06 NOTE — Assessment & Plan Note (Signed)
The right popliteal artery aneurysm measures over 2 cm in maximal diameter on the CT scan.  Mild aneurysmal degeneration of the left popliteal artery as well.  He does have a good landing zone both proximally and distally in a reasonably good right femoral artery so we can do an antegrade stick and plan to do this percutaneously.  He would really appreciate being able to go home the same day because he has an ill wife he has to care for.  I do think it needs to be repaired to avoid thrombosis given the size of the aneurysm.  Risks and benefits of the procedure were discussed and we will plan a popliteal aneurysm stent graft repair in the near future on the right side.

## 2019-04-06 NOTE — Progress Notes (Signed)
MRN : [id number]  Jeffrey Salas is a 69 y.o. (08-17-1950) male who presents with chief complaint of  Chief Complaint  Patient presents with  . Follow-up    ct results  .  History of Present Illness: Patient returns today in follow up of his popliteal artery aneurysm.  He has undergone a CT angiogram which I have independently reviewed. The right popliteal artery aneurysm measures over 2 cm in maximal diameter on the CT scan.  Mild aneurysmal degeneration of the left popliteal artery as well.  He does have a good landing zone both proximally and distally in a reasonably good right femoral artery so we can do an antegrade stick and plan to do this percutaneously.  He is having no symptoms currently.  No signs of embolization or claudication symptoms.  His stent graft repair his abdominal aortic aneurysm was also seen well on the CT scan.  There is some mild to moderate aneurysmal degeneration below the stent graft but there is no endoleak present in the abdominal aortic portion has decreased in size.  Current Outpatient Medications  Medication Sig Dispense Refill  . aspirin 81 MG chewable tablet Chew 81 mg by mouth daily.    Marland Kitchen atorvastatin (LIPITOR) 20 MG tablet Take 20 mg by mouth daily.    . carvedilol (COREG) 25 MG tablet Take 25 mg by mouth 2 (two) times daily with a meal.    . hydrALAZINE (APRESOLINE) 10 MG tablet Take 20 mg by mouth 3 (three) times daily.    Marland Kitchen olmesartan (BENICAR) 20 MG tablet Take 20 mg by mouth daily.    . vitamin B-12 (CYANOCOBALAMIN) 1000 MCG tablet Take 1,000 mcg by mouth daily.     No current facility-administered medications for this visit.    Past Medical History:  Diagnosis Date  . AAA (abdominal aortic aneurysm) ([place])   . Degenerative disc disease, lumbar   . Hyperlipidemia   . Hypertension     Past Surgical History:  Procedure Laterality Date  . DESCENDING AORTIC ANEURYSM REPAIR W/ STENT     Past Surgical History Abdominal aortic aneurysm  repair  Family History No bleeding disorders, clotting disorders, autoimmune diseases or aneurysms  Social History Social History        Tobacco Use  . Smoking status: Former Smoker    Types: Cigars    Last attempt to quit: 07/28/2015    Years since quitting: 3.0  . Smokeless tobacco: Never Used  Substance Use Topics  . Alcohol use: Yes    Alcohol/week: 3.0 standard drinks    Types: 3 Glasses of wine per week  . Drug use: Not Currently     No Known Allergies            Dispense Refill                                 No current facility-administered medications for this visit.      REVIEW OF SYSTEMS(Negative unless checked)  Constitutional: [] ???Weight loss[] ???Fever[] ???Chills Cardiac:[] ???Chest pain[] ???Chest pressure[] ???Palpitations [] ???Shortness of breath when laying flat [] ???Shortness of breath at rest [] ???Shortness of breath with exertion. Vascular: [] ???Pain in legs with walking[] ???Pain in legsat rest[] ???Pain in legs when laying flat [] ???Claudication [] ???Pain in feet when walking [] ???Pain in feet at rest [] ???Pain in feet when laying flat [] ???History of DVT [] ???Phlebitis [] ???Swelling in legs [] ???Varicose veins [] ???Non-healing ulcers Pulmonary: [] ???Uses home oxygen [] ???Productive cough[] ???Hemoptysis [] ???Wheeze [] ???COPD [] ???Asthma Neurologic: [] ???Dizziness [] ???Blackouts [] ???  Seizures [] ???History of stroke [] ???History of TIA[] ???Aphasia [] ???Temporary blindness[] ???Dysphagia [] ???Weaknessor numbness in arms [] ???Weakness or numbnessin legs Musculoskeletal: [x] ???Arthritis [] ???Joint swelling [x] ???Joint pain [x] ???Low back pain Hematologic:[] ???Easy bruising[] ???Easy bleeding [] ???Hypercoagulable state [] ???Anemic [] ???Hepatitis Gastrointestinal:[] ???Blood in stool[] ???Vomiting  blood[] ???Gastroesophageal reflux/heartburn[] ???Abdominal pain Genitourinary: [] ???Chronic kidney disease [] ???Difficulturination [] ???Frequenturination [] ???Burning with urination[] ???Hematuria Skin: [] ???Rashes [] ???Ulcers [] ???Wounds Psychological: [] ???History of anxiety[] ???History of major depression.    Physical Examination  BP 108/68 (BP Location: Right Arm)   Pulse (!) 58   Resp 16   Wt 224 lb 6.4 oz (101.8 kg)   BMI 30.02 kg/m  Gen:  WD/WN, NAD Head: [place]/AT, No temporalis wasting. Ear/Nose/Throat: Hearing grossly intact, nares w/o erythema or drainage Eyes: Conjunctiva clear. Sclera non-icteric Neck: Supple.  Trachea midline Pulmonary:  Good air movement, no use of accessory muscles.  Cardiac: RRR, no JVD Vascular:  Vessel Right Left  Radial Palpable Palpable                      Popliteal  enlarged  mildly enlarged  PT Palpable Palpable  DP Palpable Palpable   Gastrointestinal: soft, non-tender/non-distended. No guarding/reflex.  Musculoskeletal: M/S 5/5 throughout.  No deformity or atrophy.  No edema. Neurologic: Sensation grossly intact in extremities.  Symmetrical.  Speech is fluent.  Psychiatric: Judgment intact, Mood & affect appropriate for pt's clinical situation. Dermatologic: No rashes or ulcers noted.  No cellulitis or open wounds.       Labs Recent Results (from the past 2160 hour(s))  I-STAT creatinine     Status: Abnormal   Collection Time: 04/01/19 10:45 AM  Result Value Ref Range   Creatinine, Ser 1.80 (H) 0.61 - 1.24 mg/dL    Radiology CT ANGIO AO+BIFEM W & OR WO CONTRAST  Result Date: 04/01/2019 CLINICAL DATA:  Popliteal artery aneurysm EXAM: CT ANGIOGRAPHY OF ABDOMINAL AORTA WITH ILIOFEMORAL RUNOFF TECHNIQUE: Multidetector CT imaging of the abdomen, pelvis and lower extremities was performed using the standard protocol during bolus administration of intravenous contrast. Multiplanar CT image reconstructions  and MIPs were obtained to evaluate the vascular anatomy. CONTRAST:  142mL OMNIPAQUE IOHEXOL 350 MG/ML SOLN COMPARISON:  None. FINDINGS: VASCULAR Aorta: Scattered calcified atheromatous plaque and eccentric nonocclusive mural thrombus in the visualized distal descending thoracic and suprarenal segments. Patent bifurcated infrarenal stent graft. Excluded infrarenal fusiform aneurysm involving native aorta 5.2 x 4.7 cm maximum transverse dimensions. No definite endoleak although sensitivity decreased without pre contrast and delayed images. No dissection or stenosis. Celiac: Patent without evidence of aneurysm, dissection, vasculitis or significant stenosis. SMA: Patent without evidence of aneurysm, dissection, vasculitis or significant stenosis. Renals: Single left, with short-segment ostial high-grade stenosis or occlusion, more distally atheromatous plaque with long segment narrowing. Single right, with early bifurcation. Scattered partially calcified plaque proximally without definite high-grade stenosis. IMA: Origin occlusion, reconstituted distally by visceral collaterals. RIGHT Lower Extremity Inflow: Right limb of the stent graft extends to the distal common iliac, tines incompletely apposed in a fusiform aneurysm which extends to the common iliac bifurcation, measuring up to 2.5 cm maximum transverse diameter. There is ectasia of the internal iliac origin measured up to 1.3 cm diameter. External iliac is atheromatous without stenosis or aneurysm. Outflow: Mild atheromatous ectasia of the common femoral without dissection or stenosis. Deep femoral branches patent. Superficial femoral artery patent with mild scattered nonocclusive plaque Popliteal artery has bilobed aneurysm above the knee, 2.1 cm maximum transverse diameter, containing mild atheromatous plaque, without stenosis. Popliteal artery below the knee is ectatic, atheromatous, patent. Runoff: Scattered atheromatous plaque. Anterior tibial  artery seen  to the distal calf level. Diminutive peroneal artery seen to the distal calf level. Posterior tibial is patent across the ankle common dominant supply to the foot. LEFT Lower Extremity Inflow: Left limb of the stent graft extends to the distal common iliac, tines incompletely apposed, in a fusiform 2.1 cm aneurysm. Internal and external iliac arteries are mildly atheromatous, patent. Outflow: Common femoral atheromatous, patent Deep femoral artery is patent. SFA mildly atheromatous, patent Popliteal artery mildly atheromatous, ectatic, patent Runoff: Peroneal artery is seen to the distal calf. Anterior and posterior tibial arteries are opacified to just above the ankle , distal patency indeterminate due to poor scan timing. Veins: No obvious venous abnormality within the limitations of this arterial phase study. Review of the MIP images confirms the above findings. NON-VASCULAR Lower chest: Consolidation/atelectasis at the right lung base. No pleural or pericardial effusion. Hepatobiliary: No focal liver abnormality is seen. No gallstones, gallbladder wall thickening, or biliary dilatation. Pancreas: Unremarkable. No pancreatic ductal dilatation or surrounding inflammatory changes. Spleen: Normal in size without focal abnormality. Adrenals/Urinary Tract: Adrenal glands unremarkable. Left renal diffuse parenchymal atrophy and decreased enhancement probably related to long segment left renal artery stenosis. Left nephrolithiasis, largest stone 9 mm diameter in the lower pole. No hydronephrosis. Urinary bladder incompletely distended. Stomach/Bowel: Stomach is decompressed. The small bowel is nondistended. Appendix unremarkable. Colon is decompressed, unremarkable. Lymphatic: No abdominal or pelvic adenopathy. Reproductive: Mild prostate enlargement. Other: No ascites. Left pelvic phlebolith. No free air. Musculoskeletal: Multilevel lumbar spondylitic change. No fracture or worrisome bone lesion. IMPRESSION: 1. Patent  bifurcated infrarenal stent graft, with no definite endoleak; 5.2 cm native sac diameter. 2. Bilateral common iliac artery aneurysms, 2.5 cm on the right and 2.1 cm on the left. 3. Left renal artery ostial high-grade stenosis or occlusion, with diffuse renal parenchymal atrophy and decreased enhancement. 4. 2.1 cm proximal RIGHT popliteal artery bilobed aneurysm, primarily posterior tibial runoff. 5. Right lower lobe consolidation/atelectasis. 6. Left nephrolithiasis without hydronephrosis. 7. Additional ancillary findings as enumerated above, including aortoiliac and femoral-popliteal arterial occlusive disease. Please note that although the presence of coronary artery calcium documents the presence of coronary artery disease, the severity of this disease and any potential stenosis cannot be assessed on this non-gated CT examination. Assessment for potential risk factor modification, dietary therapy or pharmacologic therapy may be warranted, if clinically indicated. Electronically Signed   By: Lucrezia Europe M.D.   On: 04/01/2019 11:26    Assessment/Plan Essential hypertension blood pressure control important in reducing the progression of atherosclerotic disease. On appropriate oral medications.   Hyperlipidemia lipid control important in reducing the progression of atherosclerotic disease. Continue statin therapy  AAA (abdominal aortic aneurysm) without rupture ([facility]) Duplex earlier this year shows a patent stent graft with no evidence of endoleak. The aneurysm sac size is 5.5 cm in maximal diameter.  This was also seen on the CT and no obvious endoleak was seen.  He does have some iliac artery aneurysmal degeneration below the stent graft that will be monitored as it is moderate in size. At this point, with no endoleak I think going to an annual follow-up would be reasonable. Due to be checked in about 6 months  Popliteal artery aneurysm (HCC) The right popliteal artery aneurysm measures over 2 cm  in maximal diameter on the CT scan.  Mild aneurysmal degeneration of the left popliteal artery as well.  He does have a good landing zone both proximally and distally in a reasonably good right femoral  artery so we can do an antegrade stick and plan to do this percutaneously.  He would really appreciate being able to go home the same day because he has an ill wife he has to care for.  I do think it needs to be repaired to avoid thrombosis given the size of the aneurysm.  Risks and benefits of the procedure were discussed and we will plan a popliteal aneurysm stent graft repair in the near future on the right side.    Leotis Pain, MD  04/06/2019 11:07 AM    This note was created with Dragon medical transcription system.  Any errors from dictation are purely unintentional

## 2019-04-06 NOTE — Patient Instructions (Signed)
Angiogram  An angiogram is a procedure used to examine the blood vessels. In this procedure, contrast dye is injected through a long, thin tube (catheter) into an artery. X-rays are then taken, which show if there is a blockage or problem in a blood vessel. The catheter may be inserted in:  The groin area. This is the most common.  The fold of the arm, near the elbow.  The wrist. Tell a health care provider about:  Any allergies you have, including allergies to medicines, shellfish, contrast dye, or iodine.  All medicines you are taking, including vitamins, herbs, eye drops, creams, and over-the-counter medicines.  Any problems you or family members have had with anesthetic medicines.  Any blood disorders you have.  Any surgeries you have had.  Any medical conditions you have or have had, including any kidney problems or kidney failure.  Whether you are pregnant or may be pregnant.  Whether you are breastfeeding. What are the risks? Generally, this is a safe procedure. However, problems may occur, including:  Infection.  Bleeding and bruising.  Allergic reactions to medicines or dyes, including the contrast dye used.  Damage to nearby structures or organs, including damage to blood vessels and kidney damage from the contrast dye.  Blood clots that can lead to a stroke or heart attack. What happens before the procedure? Staying hydrated Follow instructions from your health care provider about hydration, which may include:  Up to 2 hours before the procedure - you may continue to drink clear liquids, such as water, clear fruit juice, black coffee, and plain tea.  Eating and drinking restrictions Follow instructions from your health care provider about eating and drinking, which may include:  8 hours before the procedure - stop eating heavy meals or foods, such as meat, fried foods, or fatty foods.  6 hours before the procedure - stop eating light meals or foods, such  as toast or cereal.  2 hours before the procedure - stop drinking clear liquids. Medicines Ask your health care provider about:  Changing or stopping your regular medicines. This is especially important if you are taking diabetes medicines or blood thinners.  Taking medicines such as aspirin and ibuprofen. These medicines can thin your blood. Do not take these medicines unless your health care provider tells you to take them.  Taking over-the-counter medicines, vitamins, herbs, and supplements. Surgery safety Ask your health care provider:  How your insertion site will be marked.  What steps will be taken to help prevent infection. These may include: ? Removing hair at the insertion site. ? Washing skin with a germ-killing soap. ? Taking antibiotic medicine. General instructions  Do not use any products that contain nicotine or tobacco for at least 4 weeks before the procedure. These products include cigarettes, e-cigarettes, and chewing tobacco. If you need help quitting, ask your health care provider.  You may have blood samples taken.  Plan to have someone take you home from the hospital or clinic.  If you will be going home right after the procedure, plan to have someone with you for 24 hours. What happens during the procedure?  You will lie on your back on an X-ray table. You may be strapped to the table if it is tilted.  An IV will be inserted into one of your veins.  Electrodes may be placed on your chest to monitor your heart rate during the procedure.  You will be given one or both of the following: ? A medicine to   help you relax (sedative). ? A medicine to numb the area where the catheter will be inserted (local anesthetic).  A small incision will be made for catheter insertion.  The catheter will be inserted into an artery using a guide wire. An X-ray procedure (fluoroscopy) will be used to help guide the catheter to the blood vessel to be examined.  A contrast  dye will then be injected into the catheter, and X-rays will be taken. The contrast will help to show where any narrowing or blockages are located in the blood vessels. You may feel flushed as the contrast dye is injected.  Tell your health care provider if you develop chest pain or trouble breathing.  After the fluoroscopy is complete, the catheter will be removed.  A bandage (dressing) will be placed over the site where the catheter was inserted. Pressure will be applied to help stop any bleeding.  The IV will be removed. The procedure may vary among health care providers and hospitals. What happens after the procedure?  Your blood pressure, heart rate, breathing rate, and blood oxygen level will be monitored until you leave the hospital or clinic.  You will be kept in bed lying flat for 6 hours. If the catheter was inserted through your leg, you will be instructed not to bend or cross your legs.  The insertion area and the pulse in your feet or wrist will be checked frequently.  You will be instructed to drink plenty of fluids. This will help wash the contrast dye out of your body.  You may have more blood tests and X-rays. You may also have a test that records the electrical activity of your heart (electrocardiogram, or ECG).  Do not drive for 24 hours if you were given a sedative during your procedure.  It is up to you to get the results of your procedure. Ask your health care provider, or the department that is doing the procedure, when your results will be ready. Summary  An angiogram is a procedure used to examine the blood vessels.  Before the procedure, follow your health care provider's instructions about eating and drinking restrictions. You may be asked to stop eating and drinking several hours before the procedure.  During the procedure, contrast dye is injected through a thin tube (catheter) into an artery. X-rays are then taken.  After the procedure, you will need to  drink plenty of fluids and lie flat for 6 hour. This information is not intended to replace advice given to you by your health care provider. Make sure you discuss any questions you have with your health care provider. Document Revised: 09/23/2018 Document Reviewed: 09/23/2018 Elsevier Patient Education  2020 [employer]  

## 2019-04-09 ENCOUNTER — Telehealth (INDEPENDENT_AMBULATORY_CARE_PROVIDER_SITE_OTHER): Payer: Self-pay

## 2019-04-09 NOTE — Telephone Encounter (Signed)
Spoke with the patient and he is now scheduled with Dr. Lucky Cowboy for a right popliteal aneurysm stent graft repair on 04/19/19 with a 7:45 am arrival to the [facility]. Patient will do covid testing on 04/15/19 between 12:30-2:30 pm at the [facility]. Pre-procedure instructions were discussed and will be mailed to the patient as well.

## 2019-04-15 ENCOUNTER — Other Ambulatory Visit
Admission: RE | Admit: 2019-04-15 | Discharge: 2019-04-15 | Disposition: A | Discharge status: Home / Self Care | Payer: PPO | Source: Ambulatory Visit | Attending: Vascular Surgery | Admitting: Vascular Surgery

## 2019-04-15 DIAGNOSIS — Z01812 Encounter for preprocedural laboratory examination: Secondary | ICD-10-CM | POA: Insufficient documentation

## 2019-04-15 DIAGNOSIS — Z20822 Contact with and (suspected) exposure to covid-19: Secondary | ICD-10-CM | POA: Insufficient documentation

## 2019-04-15 LAB — SARS CORONAVIRUS 2 (TAT 6-24 HRS): SARS Coronavirus 2: NEGATIVE

## 2019-04-18 ENCOUNTER — Other Ambulatory Visit (INDEPENDENT_AMBULATORY_CARE_PROVIDER_SITE_OTHER): Payer: Self-pay | Admitting: Nurse Practitioner

## 2019-04-19 ENCOUNTER — Encounter
Admission: RE | Disposition: A | Discharge status: Home / Self Care | Payer: Self-pay | Source: Home / Self Care | Attending: Vascular Surgery

## 2019-04-19 ENCOUNTER — Encounter: Payer: Self-pay | Admitting: Vascular Surgery

## 2019-04-19 ENCOUNTER — Other Ambulatory Visit: Payer: Self-pay

## 2019-04-19 ENCOUNTER — Ambulatory Visit
Admission: RE | Admit: 2019-04-19 | Discharge: 2019-04-19 | Disposition: A | Discharge status: Home / Self Care | Payer: PPO | Attending: Vascular Surgery | Admitting: Vascular Surgery

## 2019-04-19 DIAGNOSIS — I714 Abdominal aortic aneurysm, without rupture: Secondary | ICD-10-CM | POA: Diagnosis not present

## 2019-04-19 DIAGNOSIS — Z87891 Personal history of nicotine dependence: Secondary | ICD-10-CM | POA: Insufficient documentation

## 2019-04-19 DIAGNOSIS — Z7982 Long term (current) use of aspirin: Secondary | ICD-10-CM | POA: Insufficient documentation

## 2019-04-19 DIAGNOSIS — Z79899 Other long term (current) drug therapy: Secondary | ICD-10-CM | POA: Diagnosis not present

## 2019-04-19 DIAGNOSIS — I724 Aneurysm of artery of lower extremity: Secondary | ICD-10-CM | POA: Diagnosis not present

## 2019-04-19 DIAGNOSIS — I10 Essential (primary) hypertension: Secondary | ICD-10-CM | POA: Diagnosis not present

## 2019-04-19 DIAGNOSIS — E785 Hyperlipidemia, unspecified: Secondary | ICD-10-CM | POA: Diagnosis not present

## 2019-04-19 HISTORY — PX: LOWER EXTREMITY ANGIOGRAPHY: CATH118251

## 2019-04-19 LAB — CREATININE, SERUM
Creatinine, Ser: 1.41 mg/dL — ABNORMAL HIGH (ref 0.61–1.24)
GFR calc Af Amer: 58 mL/min — ABNORMAL LOW (ref >60)
GFR calc non Af Amer: 50 mL/min — ABNORMAL LOW (ref >60)

## 2019-04-19 LAB — BUN: BUN: 23 mg/dL (ref 8–23)

## 2019-04-19 SURGERY — LOWER EXTREMITY ANGIOGRAPHY
Anesthesia: Moderate Sedation | Laterality: Right

## 2019-04-19 MED ORDER — MIDAZOLAM HCL 5 MG/5ML IJ SOLN
INTRAMUSCULAR | Status: AC
  Filled 2019-04-19: qty 5

## 2019-04-19 MED ORDER — SODIUM CHLORIDE 0.9% FLUSH: 3.0000 mL | Freq: Two times a day (BID) | INTRAVENOUS | Status: DC

## 2019-04-19 MED ORDER — CLOPIDOGREL BISULFATE 75 MG PO TABS: 75.0000 mg | ORAL_TABLET | Freq: Every day | ORAL | 11 refills | Status: DC

## 2019-04-19 MED ORDER — HEPARIN SODIUM (PORCINE) 1000 UNIT/ML IJ SOLN
INTRAMUSCULAR | Status: DC | PRN
  Administered 2019-04-19: 5000 [IU] via INTRAVENOUS

## 2019-04-19 MED ORDER — CLOPIDOGREL BISULFATE 75 MG PO TABS
75.0000 mg | ORAL_TABLET | Freq: Every day | ORAL | Status: DC
  Administered 2019-04-19: 75 mg via ORAL

## 2019-04-19 MED ORDER — HYDROMORPHONE HCL 1 MG/ML IJ SOLN: 1.0000 mg | Freq: Once | INTRAMUSCULAR | Status: DC | PRN

## 2019-04-19 MED ORDER — LABETALOL HCL 5 MG/ML IV SOLN: 10.0000 mg | INTRAVENOUS | Status: DC | PRN

## 2019-04-19 MED ORDER — HYDRALAZINE HCL 20 MG/ML IJ SOLN: 5.0000 mg | INTRAMUSCULAR | Status: DC | PRN

## 2019-04-19 MED ORDER — FENTANYL CITRATE (PF) 100 MCG/2ML IJ SOLN
INTRAMUSCULAR | Status: AC
  Filled 2019-04-19: qty 2

## 2019-04-19 MED ORDER — ONDANSETRON HCL 4 MG/2ML IJ SOLN: 4.0000 mg | Freq: Four times a day (QID) | INTRAMUSCULAR | Status: DC | PRN

## 2019-04-19 MED ORDER — MIDAZOLAM HCL 2 MG/ML PO SYRP: 8.0000 mg | ORAL_SOLUTION | Freq: Once | ORAL | Status: DC | PRN

## 2019-04-19 MED ORDER — FAMOTIDINE 20 MG PO TABS: 40.0000 mg | ORAL_TABLET | Freq: Once | ORAL | Status: DC | PRN

## 2019-04-19 MED ORDER — MIDAZOLAM HCL 2 MG/2ML IJ SOLN
INTRAMUSCULAR | Status: DC | PRN
  Administered 2019-04-19: 1 mg via INTRAVENOUS
  Administered 2019-04-19: 2 mg via INTRAVENOUS
  Administered 2019-04-19: 1 mg via INTRAVENOUS
  Administered 2019-04-19: 0.5 mg
  Administered 2019-04-19: 0.5 mg via INTRAVENOUS

## 2019-04-19 MED ORDER — FENTANYL CITRATE (PF) 100 MCG/2ML IJ SOLN
INTRAMUSCULAR | Status: DC | PRN
  Administered 2019-04-19 (×3): 50 ug via INTRAVENOUS
  Administered 2019-04-19: 25 ug via INTRAVENOUS

## 2019-04-19 MED ORDER — CEFAZOLIN SODIUM-DEXTROSE 2-4 GM/100ML-% IV SOLN
2.0000 g | Freq: Once | INTRAVENOUS | Status: AC
  Administered 2019-04-19: 09:00:00 2 g via INTRAVENOUS

## 2019-04-19 MED ORDER — ACETAMINOPHEN 325 MG PO TABS: 650.0000 mg | ORAL_TABLET | ORAL | Status: DC | PRN

## 2019-04-19 MED ORDER — IODIXANOL 320 MG/ML IV SOLN
INTRAVENOUS | Status: DC | PRN
  Administered 2019-04-19: 70 mL via INTRA_ARTERIAL

## 2019-04-19 MED ORDER — DIPHENHYDRAMINE HCL 50 MG/ML IJ SOLN: 50.0000 mg | Freq: Once | INTRAMUSCULAR | Status: DC | PRN

## 2019-04-19 MED ORDER — SODIUM CHLORIDE 0.9 % IV SOLN
INTRAVENOUS | Status: DC
  Administered 2019-04-19: 08:00:00 1000 mL via INTRAVENOUS

## 2019-04-19 MED ORDER — FENTANYL CITRATE (PF) 100 MCG/2ML IJ SOLN
INTRAMUSCULAR | Status: DC | PRN
  Administered 2019-04-19: 25 ug via INTRAVENOUS

## 2019-04-19 MED ORDER — HEPARIN SODIUM (PORCINE) 1000 UNIT/ML IJ SOLN
INTRAMUSCULAR | Status: AC
  Filled 2019-04-19: qty 1

## 2019-04-19 MED ORDER — SODIUM CHLORIDE 0.9 % IV SOLN: 250.0000 mL | INTRAVENOUS | Status: DC | PRN

## 2019-04-19 MED ORDER — CLOPIDOGREL BISULFATE 75 MG PO TABS
ORAL_TABLET | ORAL | Status: AC
  Filled 2019-04-19: qty 1

## 2019-04-19 MED ORDER — SODIUM CHLORIDE 0.9% FLUSH: 3.0000 mL | INTRAVENOUS | Status: DC | PRN

## 2019-04-19 MED ORDER — SODIUM CHLORIDE 0.9 % IV SOLN: INTRAVENOUS | Status: DC

## 2019-04-19 MED ORDER — METHYLPREDNISOLONE SODIUM SUCC 125 MG IJ SOLR: 125.0000 mg | Freq: Once | INTRAMUSCULAR | Status: DC | PRN

## 2019-04-19 SURGICAL SUPPLY — 25 items
BALLN LUTONIX AV 9X60X75 (BALLOONS) ×2
BALLN ULTRVRSE 10X60X75 (BALLOONS) ×2
BALLN ULTRVRSE 7X60X75C (BALLOONS) ×2
BALLOON LUTONIX AV 9X60X75 (BALLOONS) ×1 IMPLANT
BALLOON ULTRVRSE 10X60X75 (BALLOONS) ×1 IMPLANT
BALLOON ULTRVRSE 7X60X75C (BALLOONS) ×1 IMPLANT
CANNULA 5F STIFF (CANNULA) ×2 IMPLANT
CATH BEACON 5 .035 65 KMP TIP (CATHETERS) ×2 IMPLANT
CATH SEEKER .035X90 (CATHETERS) ×2 IMPLANT
COVER PROBE U/S 5X48 (MISCELLANEOUS) ×2 IMPLANT
DEVICE PRESTO INFLATION (MISCELLANEOUS) ×2 IMPLANT
DEVICE STARCLOSE SE CLOSURE (Vascular Products) ×2 IMPLANT
GUIDEWIRE SUPER STIFF .035X180 (WIRE) ×2 IMPLANT
INTRODUCER 7FR 23CM (INTRODUCER) ×2 IMPLANT
PACK ANGIOGRAPHY (CUSTOM PROCEDURE TRAY) ×2 IMPLANT
SET INTRO CAPELLA COAXIAL (SET/KITS/TRAYS/PACK) ×2 IMPLANT
SHEATH BRITE TIP 5FRX11 (SHEATH) ×2 IMPLANT
SHEATH BRITE TIP 8FRX11 (SHEATH) ×2 IMPLANT
STENT VIABAHN 10X10X120 (Permanent Stent) ×2 IMPLANT
STENT VIABAHN 10X15X120 (Permanent Stent) IMPLANT
STENT VIABAHN 10X5X120 (Permanent Stent) ×2 IMPLANT
STENT VIABAHN 9X10X120 (Permanent Stent) ×2 IMPLANT
WIRE G V18X300CM (WIRE) ×2 IMPLANT
WIRE J 3MM .035X145CM (WIRE) ×2 IMPLANT
WIRE NITINOL .018 (WIRE) ×2 IMPLANT

## 2019-04-19 NOTE — H&P (Signed)
[facility] History & Physical Update  The patient was interviewed and re-examined.  The patient's previous History and Physical has been reviewed and is unchanged.  There is no change in the plan of care. We plan to proceed with the scheduled procedure.  Leotis Pain, MD  04/19/2019, 8:12 AM

## 2019-04-19 NOTE — Op Note (Signed)
[facility]  Percutaneous Study/Intervention Procedural Note   Date of Surgery: 04/19/2019  Surgeon(s):Mailee Klaas    Assistants:none  Pre-operative Diagnosis: right popliteal aneurysm  Post-operative diagnosis:  Same  Procedure(s) Performed:             1.  Ultrasound guidance for vascular access right femoral artery in an antegrade fashion             2.  Catheter placement into right popliteal artery from right femoral approach             3.  Selective right lower extremity angiogram             4.  Viabahn stent placement x 3 to the right SFA and popliteal artery to treat the popliteal aneurysm             5.  StarClose closure device right femoral artery  EBL: 20 cc  Contrast: 70 cc  Fluoro Time: 11.6 minutes  Moderate Conscious Sedation Time: approximately 60 minutes using 5 mg of Versed and 200 mcg of Fentanyl              Indications:  Patient is a 69 y.o.male with a >2 cm popliteal aneurysm. The patient is brought in for angiography for further evaluation and potential treatment.  Due to the limb threatening nature of the situation, angiogram was performed for attempted limb salvage due to the risk of thrombosis. The patient is aware that if the procedure fails, amputation would be expected.  The patient also understands that even with successful revascularization, amputation may still be required due to the severity of the situation. Risks and benefits are discussed and informed consent is obtained.   Procedure:  The patient was identified and appropriate procedural time out was performed.  The patient was then placed supine on the table and prepped and draped in the usual sterile fashion. Moderate conscious sedation was administered during a face to face encounter with the patient throughout the procedure with my supervision of the RN administering medicines and monitoring the patient's vital signs, pulse oximetry, telemetry and mental status  throughout from the start of the procedure until the patient was taken to the recovery room. Ultrasound was used to evaluate the right common femoral artery.  It was patent.  A digital ultrasound image was acquired.  A micropuncture needle was used to access the right common femoral artery under direct ultrasound guidance in an antegrade fashion and a permanent image was performed.   A micropuncture wire and sheath were then placed and then upsized to A 0.035 J wire was advanced without resistance and a 5Fr sheath was placed.   Sequentially, we upsized to a 7 and then an 8 [place] sheath.  Selective right lower extremity angiogram was then performed. This demonstrated a very generous SFA until it became aneurysmal in the popliteal artery.  The below-knee popliteal artery normalized although the tibial vessels were still quite generous.  There was three-vessel runoff distally although the posterior tibial artery was the dominant runoff distally. It was felt that it was in the patient's best interest to proceed with intervention after these images to avoid a second procedure and a larger amount of contrast and fluoroscopy based off of the findings from the initial angiogram. The patient was systemically heparinized.  A wire was parked down into the posterior tibial artery.  Detailed measurements were made and elected to place a 9 mm stent distally and then build up with  10 mm stents proximally.  A 9 mm diameter by 10 cm length stent was parked just into the below-knee popliteal artery.  Due to limitations in our stent sizes and lengths, a 10 mm diameter by 10 cm length 10 mm diameter by 5 cm length stent were then built up into the proximal to mid SFA where the vessel was no longer aneurysmal.  9 mm balloon was used distally and 10 mm balloons for the junction point of proximal seal zone.  Completion imaging showed excellent seal with no blood flow in the popliteal aneurysm and maintained flow distally.  I elected to  terminate the procedure. The sheath was removed and StarClose closure device was deployed in the right femoral artery with excellent hemostatic result. The patient was taken to the recovery room in stable condition having tolerated the procedure well.  Findings:                          Right Lower Extremity:  This demonstrated a very generous SFA until it became aneurysmal in the popliteal artery.  The below-knee popliteal artery normalized although the tibial vessels were still quite generous.  There was three-vessel runoff distally although the posterior tibial artery was the dominant runoff distally.   Disposition: Patient was taken to the recovery room in stable condition having tolerated the procedure well.  Complications: None  Leotis Pain 04/19/2019 10:39 AM   This note was created with Dragon Medical transcription system. Any errors in dictation are purely unintentional.

## 2019-04-23 ENCOUNTER — Telehealth (INDEPENDENT_AMBULATORY_CARE_PROVIDER_SITE_OTHER): Payer: Self-pay | Admitting: Vascular Surgery

## 2019-04-23 NOTE — Telephone Encounter (Signed)
This is reassuring, this may be some nerve irritation and also because they had to use a little larger sheath for access.  Some bruising is normal as well.  He can try some ice to the area to help, also we can send in a little stronger pain medication than OTC if he likes, just let us know which pharmacy

## 2019-04-23 NOTE — Telephone Encounter (Signed)
Some pain is expected however where is the pain, is it in the groin, knee area, whole leg? Is it only when he is up and moving or constant.  He can take ibuprofen and/or tylenol to see if this helps with the pain.

## 2019-04-23 NOTE — Telephone Encounter (Signed)
Patient has been made aware with medical advice and stated at this time he will just alternate tylenol and ibuprofen   for pain

## 2019-04-23 NOTE — Telephone Encounter (Signed)
Patient stated that he feels a sharp pain in the inner thigh and mostly when he is trying to get up

## 2019-05-10 ENCOUNTER — Other Ambulatory Visit (INDEPENDENT_AMBULATORY_CARE_PROVIDER_SITE_OTHER): Payer: Self-pay | Admitting: Vascular Surgery

## 2019-05-10 DIAGNOSIS — I724 Aneurysm of artery of lower extremity: Secondary | ICD-10-CM

## 2019-05-11 ENCOUNTER — Encounter (INDEPENDENT_AMBULATORY_CARE_PROVIDER_SITE_OTHER): Payer: Self-pay | Admitting: Vascular Surgery

## 2019-05-11 ENCOUNTER — Other Ambulatory Visit: Payer: Self-pay

## 2019-05-11 ENCOUNTER — Ambulatory Visit (INDEPENDENT_AMBULATORY_CARE_PROVIDER_SITE_OTHER): Payer: PPO | Admitting: Vascular Surgery

## 2019-05-11 ENCOUNTER — Ambulatory Visit (INDEPENDENT_AMBULATORY_CARE_PROVIDER_SITE_OTHER): Payer: PPO

## 2019-05-11 VITALS — BP 116/75 | HR 58 | Resp 14 | Ht 73.0 in | Wt 230.0 lb

## 2019-05-11 DIAGNOSIS — E785 Hyperlipidemia, unspecified: Secondary | ICD-10-CM

## 2019-05-11 DIAGNOSIS — I714 Abdominal aortic aneurysm, without rupture, unspecified: Secondary | ICD-10-CM

## 2019-05-11 DIAGNOSIS — I10 Essential (primary) hypertension: Secondary | ICD-10-CM | POA: Diagnosis not present

## 2019-05-11 DIAGNOSIS — I724 Aneurysm of artery of lower extremity: Secondary | ICD-10-CM

## 2019-05-11 NOTE — Progress Notes (Signed)
MRN : [id number]  Jeffrey Salas is a 69 y.o. (1950-11-03) male who presents with chief complaint of  Chief Complaint  Patient presents with  . Follow-up    U/s follow up  .  History of Present Illness: Patient returns today in follow up of his popliteal aneurysm repair from about 3 to 4 weeks ago with a stent graft.  He is doing well.  No specific complaints today.  He did have a lot of groin pain after the procedure which was an 8 [place] sheath in an antegrade fashion, but this has resolved at this point.  No leg swelling.  He has had some issues with the Plavix and seems to have a change in his urinary frequency that is noticeable.  He is going to have his primary care physician check his urine function.  It has not been blood-tinged. Stent graft is patent.  The popliteal artery diameter was down to about 1.7 cm in maximal diameter on duplex today with no flow within the aneurysm sac.  Current Outpatient Medications  Medication Sig Dispense Refill  . aspirin 81 MG chewable tablet Chew 81 mg by mouth daily.    Marland Kitchen atorvastatin (LIPITOR) 20 MG tablet Take 20 mg by mouth daily.    . carvedilol (COREG) 25 MG tablet Take 25 mg by mouth 2 (two) times daily with a meal.    . clopidogrel (PLAVIX) 75 MG tablet Take 1 tablet (75 mg total) by mouth daily. 30 tablet 11  . hydrALAZINE (APRESOLINE) 10 MG tablet Take 20 mg by mouth 3 (three) times daily.    Marland Kitchen olmesartan (BENICAR) 20 MG tablet Take 20 mg by mouth daily.    . vitamin B-12 (CYANOCOBALAMIN) 1000 MCG tablet Take 1,000 mcg by mouth daily.     No current facility-administered medications for this visit.    Past Medical History:  Diagnosis Date  . AAA (abdominal aortic aneurysm) ([place])   . Degenerative disc disease, lumbar   . Hyperlipidemia   . Hypertension     Past Surgical History:  Procedure Laterality Date  . DESCENDING AORTIC ANEURYSM REPAIR W/ STENT    . LOWER EXTREMITY ANGIOGRAPHY Right 04/19/2019   Procedure: LOWER EXTREMITY  ANGIOGRAPHY;  Surgeon: Algernon Huxley, MD;  Location: [facility];  Service: Cardiovascular;  Laterality: Right;     Social History   Tobacco Use  . Smoking status: Former Smoker    Types: Cigars    Quit date: 07/28/2015    Years since quitting: 3.7  . Smokeless tobacco: Never Used  Substance Use Topics  . Alcohol use: Yes    Alcohol/week: 3.0 standard drinks    Types: 3 Glasses of wine per week  . Drug use: Not Currently    Family History  Problem Relation Age of Onset  . Stroke Mother   . COPD Brother      No Known Allergies   REVIEW OF SYSTEMS(Negative unless checked)  Constitutional: [] ????Weight loss[] ????Fever[] ????Chills Cardiac:[] ????Chest pain[] ????Chest pressure[] ????Palpitations [] ????Shortness of breath when laying flat [] ????Shortness of breath at rest [] ????Shortness of breath with exertion. Vascular: [] ????Pain in legs with walking[] ????Pain in legsat rest[] ????Pain in legs when laying flat [] ????Claudication [] ????Pain in feet when walking [] ????Pain in feet at rest [] ????Pain in feet when laying flat [] ????History of DVT [] ????Phlebitis [] ????Swelling in legs [] ????Varicose veins [] ????Non-healing ulcers Pulmonary: [] ????Uses home oxygen [] ????Productive cough[] ????Hemoptysis [] ????Wheeze [] ????COPD [] ????Asthma Neurologic: [] ????Dizziness [] ????Blackouts [] ????Seizures [] ????History of stroke [] ????History of TIA[] ????Aphasia [] ????Temporary blindness[] ????Dysphagia [] ????Weaknessor numbness in arms [] ????Weakness or numbnessin legs  Musculoskeletal: [x] ????Arthritis [] ????Joint swelling [x] ????Joint pain [x] ????Low back pain Hematologic:[] ????Easy bruising[] ????Easy bleeding [] ????Hypercoagulable state [] ????Anemic [] ????Hepatitis Gastrointestinal:[] ????Blood in stool[] ????Vomiting blood[] ????Gastroesophageal reflux/heartburn[] ????Abdominal  pain Genitourinary: [] ????Chronic kidney disease [] ????Difficulturination [] ????Frequenturination [] ????Burning with urination[] ????Hematuria Skin: [] ????Rashes [] ????Ulcers [] ????Wounds Psychological: [] ????History of anxiety[] ????History of major depression.  Physical Examination  BP 116/75 (BP Location: Right Arm)   Pulse (!) 58   Resp 14   Ht 6\' 1"  (1.854 m)   Wt 230 lb (104.3 kg)   BMI 30.34 kg/m  Gen:  WD/WN, NAD Head: [place]/AT, No temporalis wasting. Ear/Nose/Throat: Hearing grossly intact, nares w/o erythema or drainage Eyes: Conjunctiva clear. Sclera non-icteric Neck: Supple.  Trachea midline Pulmonary:  Good air movement, no use of accessory muscles.  Cardiac: RRR, no JVD Vascular:  Vessel Right Left  Radial Palpable Palpable                          PT Palpable Palpable  DP Palpable Palpable    Musculoskeletal: M/S 5/5 throughout.  No deformity or atrophy. No edema. Neurologic: Sensation grossly intact in extremities.  Symmetrical.  Speech is fluent.  Psychiatric: Judgment intact, Mood & affect appropriate for pt's clinical situation. Dermatologic: No rashes or ulcers noted.  No cellulitis or open wounds. Access site well healed       Labs Recent Results (from the past 2160 hour(s))  I-STAT creatinine     Status: Abnormal   Collection Time: 04/01/19 10:45 AM  Result Value Ref Range   Creatinine, Ser 1.80 (H) 0.61 - 1.24 mg/dL  SARS CORONAVIRUS 2 (TAT 6-24 HRS) Nasopharyngeal Nasopharyngeal Swab     Status: None   Collection Time: 04/15/19  2:32 PM   Specimen: Nasopharyngeal Swab  Result Value Ref Range   SARS Coronavirus 2 NEGATIVE NEGATIVE    Comment: (NOTE) SARS-CoV-2 target nucleic acids are NOT DETECTED. The SARS-CoV-2 RNA is generally detectable in upper and lower respiratory specimens during the acute phase of infection. Negative results do not preclude SARS-CoV-2 infection, do not rule out co-infections with other  pathogens, and should not be used as the sole basis for treatment or other patient management decisions. Negative results must be combined with clinical observations, patient history, and epidemiological information. The expected result is Negative. Fact Sheet for Patients: SugarRoll.be Fact Sheet for Healthcare Providers: https://www.woods-mathews.com/ This test is not yet approved or cleared by the [place] FDA and  has been authorized for detection and/or diagnosis of SARS-CoV-2 by FDA under an Emergency Use Authorization (EUA). This EUA will remain  in effect (meaning this test can be used) for the duration of the COVID-19 declaration under Section 56 4(b)(1) of the Act, 21 U.S.C. section 360bbb-3(b)(1), unless the authorization is terminated or revoked sooner. Performed at [facility], [place] [address], [place], [place] [address]   BUN     Status: None   Collection Time: 04/19/19  8:07 AM  Result Value Ref Range   BUN 23 8 - 23 mg/dL    Comment: Performed at [facility], [address] [place], [place] [address]  Creatinine, serum     Status: Abnormal   Collection Time: 04/19/19  8:07 AM  Result Value Ref Range   Creatinine, Ser 1.41 (H) 0.61 - 1.24 mg/dL   GFR calc non Af Amer 50 (L) >60 mL/min   GFR calc Af Amer 58 (L) >60 mL/min    Comment: Performed at [facility], [address], [place], [place] [address]    Radiology  PERIPHERAL VASCULAR CATHETERIZATION  Result Date: 04/19/2019 See op note   Assessment/Plan Essential hypertension blood pressure control important in reducing the progression of atherosclerotic disease and aneurysmal degeneration. On appropriate oral medications.   Hyperlipidemia lipid control important in reducing the progression of atherosclerotic disease. Continue statin therapy  AAA (abdominal aortic aneurysm) without rupture (HCC) Status post repair.  Due to be  checked in May.  Popliteal artery aneurysm [facility]) Doing well status post repair.  Stent graft is patent.  The popliteal artery diameter was down to about 1.7 cm in maximal diameter on duplex today with no flow within the aneurysm sac.  Plan follow-up in about 3 months.  We will continue Plavix for 3 months if he is able to tolerate it.    Leotis Pain, MD  05/11/2019 8:52 AM    This note was created with Dragon medical transcription system.  Any errors from dictation are purely unintentional

## 2019-05-11 NOTE — Assessment & Plan Note (Signed)
Doing well status post repair.  Stent graft is patent.  The popliteal artery diameter was down to about 1.7 cm in maximal diameter on duplex today with no flow within the aneurysm sac.  Plan follow-up in about 3 months.  We will continue Plavix for 3 months if he is able to tolerate it.

## 2019-07-15 DIAGNOSIS — E782 Mixed hyperlipidemia: Secondary | ICD-10-CM | POA: Diagnosis not present

## 2019-07-15 DIAGNOSIS — Z125 Encounter for screening for malignant neoplasm of prostate: Secondary | ICD-10-CM | POA: Diagnosis not present

## 2019-07-23 DIAGNOSIS — N1832 Chronic kidney disease, stage 3b: Secondary | ICD-10-CM | POA: Diagnosis not present

## 2019-07-23 DIAGNOSIS — I724 Aneurysm of artery of lower extremity: Secondary | ICD-10-CM | POA: Diagnosis not present

## 2019-07-23 DIAGNOSIS — E782 Mixed hyperlipidemia: Secondary | ICD-10-CM | POA: Diagnosis not present

## 2019-07-23 DIAGNOSIS — Z0000 Encounter for general adult medical examination without abnormal findings: Secondary | ICD-10-CM | POA: Diagnosis not present

## 2019-08-10 ENCOUNTER — Other Ambulatory Visit (INDEPENDENT_AMBULATORY_CARE_PROVIDER_SITE_OTHER): Payer: Self-pay | Admitting: Vascular Surgery

## 2019-08-10 DIAGNOSIS — I724 Aneurysm of artery of lower extremity: Secondary | ICD-10-CM

## 2019-08-10 DIAGNOSIS — Z95820 Peripheral vascular angioplasty status with implants and grafts: Secondary | ICD-10-CM

## 2019-08-13 ENCOUNTER — Ambulatory Visit (INDEPENDENT_AMBULATORY_CARE_PROVIDER_SITE_OTHER): Payer: PPO

## 2019-08-13 ENCOUNTER — Other Ambulatory Visit: Payer: Self-pay

## 2019-08-13 ENCOUNTER — Ambulatory Visit (INDEPENDENT_AMBULATORY_CARE_PROVIDER_SITE_OTHER): Payer: PPO | Admitting: Vascular Surgery

## 2019-08-13 ENCOUNTER — Encounter (INDEPENDENT_AMBULATORY_CARE_PROVIDER_SITE_OTHER): Payer: Self-pay | Admitting: Vascular Surgery

## 2019-08-13 VITALS — BP 117/76 | HR 73 | Ht 73.0 in | Wt 234.0 lb

## 2019-08-13 DIAGNOSIS — I10 Essential (primary) hypertension: Secondary | ICD-10-CM

## 2019-08-13 DIAGNOSIS — Z95820 Peripheral vascular angioplasty status with implants and grafts: Secondary | ICD-10-CM

## 2019-08-13 DIAGNOSIS — E785 Hyperlipidemia, unspecified: Secondary | ICD-10-CM

## 2019-08-13 DIAGNOSIS — I714 Abdominal aortic aneurysm, without rupture, unspecified: Secondary | ICD-10-CM

## 2019-08-13 DIAGNOSIS — I724 Aneurysm of artery of lower extremity: Secondary | ICD-10-CM

## 2019-08-13 NOTE — Assessment & Plan Note (Signed)
Noninvasive studies today demonstrate an aortic aneurysm sac measuring 3.8 cm in maximal diameter with no evidence of endoleak.  This is down from a previous study of 4.8 last year He is doing well.  We will continue to follow this on an annual basis.  No change in medical regimen.

## 2019-08-13 NOTE — Patient Instructions (Signed)
Abdominal Aortic Aneurysm ° °An aneurysm is a bulge in an artery. It happens when blood pushes against a weakened or damaged artery wall. An abdominal aortic aneurysm (AAA) is an aneurysm that occurs in the lower part of the aorta, which is the main artery of the body. The aorta supplies blood from the heart to the rest of the body. °Some aneurysms may not cause symptoms. However, an AAA can cause two serious problems: °· It can enlarge and burst (rupture). °· It can cause blood to flow between the layers of the wall of the aorta through a tear (aortic dissection). °Both of these problems are medical emergencies. They can cause bleeding inside the body. If they are not diagnosed and treated right away, they can be life-threatening. °What are the causes? °The exact cause of this condition is not known. °What increases the risk? °The following factors may make you more likely to develop this condition: °· Being a male 60 years of age or older. °· Being Caucasian. °· Using tobacco or having a history of tobacco use. °· Having a family history of aneurysms. °· Having any of the following conditions: °? Hardening of the arteries (arteriosclerosis). °? Inflammation of the walls of an artery (arteritis). °? Certain genetic conditions. °? Obesity. °? An infection in the wall of the aorta (infectious aortitis) caused by bacteria. °? High cholesterol. °? High blood pressure (hypertension). °What are the signs or symptoms? °Symptoms of this condition vary depending on the size of the aneurysm and how fast it is growing. Most aneurysms grow slowly and do not cause any symptoms. When symptoms do occur, they may include: °· Pain in the abdomen, side, or lower back. °· Feeling full after eating only small amounts of food. °· Feeling a pulsating lump in the abdomen. °Symptoms that the aneurysm has ruptured include: °· A sudden onset of severe pain in the abdomen, side, or back. °· Nausea or vomiting. °· Feeling light-headed or  passing out. °How is this diagnosed? °This condition may be diagnosed with: °· A physical exam to check for throbbing and to listen to blood flow in your abdomen. °· Tests, such as: °? Ultrasound. °? X-rays. °? CT scan. °? MRI. °? Tests to check your arteries for damage or blockage (angiogram). °Because most unruptured AAAs cause no symptoms, they are often found during exams for other conditions. °How is this treated? °Treatment for this condition depends on: °· The size of the aneurysm. °· How fast the aneurysm is growing. °· Your age. °· Risk factors for rupture. °If your aneurysm is smaller than 2 inches (5 cm), your health care provider may manage it by: °· Checking (monitoring) it regularly to see if it is getting bigger. Depending on the size of the aneurysm, how fast it is growing, and your other risk factors, you may have an ultrasound to monitor it every 3-6 months, every year, or every few years. °· Giving you medicines to control blood pressure, treat pain, or fight infection. °If your aneurysm is larger than 2 inches (5 cm), your health care provider may do surgery to repair it. °Follow these instructions at home: °Lifestyle °· Do not use any products that contain nicotine or tobacco, such as cigarettes, e-cigarettes, and chewing tobacco. If you need help quitting, ask your health care provider. °· Stay physically active and exercise regularly. Talk with your health care provider about how often you should exercise and which types of exercise are safe for you. °Eating and drinking °·   Eat a heart-healthy diet. This includes plenty of fresh fruits and vegetables, whole grains, low-fat (lean) protein, and low-fat dairy products. °· Avoid foods that are high in saturated fat and cholesterol, such as red meat and some dairy products. °· Do not drink alcohol if: °? Your health care provider tells you not to drink. °? You are pregnant, may be pregnant, or are planning to become pregnant. °· If you drink  alcohol: °? Limit how much you use to: °§ 0-1 drink a day for women. °§ 0-2 drinks a day for men. °? Be aware of how much alcohol is in your drink. In the [place] one drink equals one typical bottle of beer (12 oz), one-half glass of wine (5 oz), or one shot of hard liquor (1½ oz). °General instructions °· Take over-the-counter and prescription medicines only as told by your health care provider. °· Keep your blood pressure within a normal range. Check it regularly, and ask your health care provider what your target blood pressure should be. °· Have your blood sugar (glucose) level and cholesterol levels checked regularly. Follow your health care provider's instructions on how to keep levels within normal limits. °· Avoid heavy lifting and activities that take a lot of effort (are strenuous). Ask your health care provider what activities are safe for you. °· Keep all follow-up visits as told by your health care provider. This is important. °Contact a health care provider if you: °· Have pain in your abdomen, side, or back. °· Have a throbbing feeling in your abdomen. °Get help right away if you: °· Have sudden, severe pain in your abdomen, side, or back. °· Experience nausea or vomiting. °· Have constipation or problems urinating. °· Feel light-headed. °· Have a rapid heart rate when you stand. °· Have sweaty, clammy skin. °· Have shortness of breath. °· Have a fever. °These symptoms may represent a serious problem that is an emergency. Do not wait to see if the symptoms will go away. Get medical help right away. Call your local emergency services (911 in the [place] Do not drive yourself to the hospital. °Summary °· An aneurysm is a bulge in an artery. It happens when blood pushes against a weakened or damaged artery wall. °· Being older, male, Caucasian, having a history of tobacco use, and a family history of aneurysms can increase the risk. °· These problems can cause bleeding inside the body and can be  life-threatening. Get medical help right away. °This information is not intended to replace advice given to you by your health care provider. Make sure you discuss any questions you have with your health care provider. °Document Revised: 06/29/2018 Document Reviewed: 10/18/2017 °Elsevier Patient Education © 2020 [employer] ° °

## 2019-08-13 NOTE — Progress Notes (Signed)
MRN : [id number]  Jeffrey Salas is a 69 y.o. ([birth year]) male who presents with chief complaint of  Chief Complaint  Patient presents with  . Follow-up    U/S Follow up  .  History of Present Illness: Patient returns today in follow up of multiple vascular issues.  He is about 4 months status post right popliteal aneurysm repair by me in about 4 years status post abdominal aortic aneurysm at another site.  He is doing well today.  No current aneurysm related symptoms. Specifically, the patient denies new back or abdominal pain, or signs of peripheral embolization Noninvasive studies today demonstrate an aortic aneurysm sac measuring 3.8 cm in maximal diameter with no evidence of endoleak.  This is down from a previous study of 4.8 last year His popliteal aneurysm stent graft is widely patent without stenosis on duplex today.  The size of the popliteal aneurysm was somewhere in the 1.7 to 1.8 cm range which is stable from previous studies.  Current Outpatient Medications  Medication Sig Dispense Refill  . aspirin 81 MG chewable tablet Chew 81 mg by mouth daily.    Marland Kitchen atorvastatin (LIPITOR) 20 MG tablet Take 20 mg by mouth daily.    . carvedilol (COREG) 25 MG tablet Take 25 mg by mouth 2 (two) times daily with a meal.    . clopidogrel (PLAVIX) 75 MG tablet Take 1 tablet (75 mg total) by mouth daily. 30 tablet 11  . hydrALAZINE (APRESOLINE) 10 MG tablet Take 20 mg by mouth 3 (three) times daily.    Marland Kitchen olmesartan (BENICAR) 20 MG tablet Take 20 mg by mouth daily.    . vitamin B-12 (CYANOCOBALAMIN) 1000 MCG tablet Take 1,000 mcg by mouth daily.     No current facility-administered medications for this visit.    Past Medical History:  Diagnosis Date  . AAA (abdominal aortic aneurysm) ([place])   . Degenerative disc disease, lumbar   . Hyperlipidemia   . Hypertension     Past Surgical History:  Procedure Laterality Date  . DESCENDING AORTIC ANEURYSM REPAIR W/ STENT    . LOWER EXTREMITY  ANGIOGRAPHY Right 04/19/2019   Procedure: LOWER EXTREMITY ANGIOGRAPHY;  Surgeon: Algernon Huxley, MD;  Location: [facility];  Service: Cardiovascular;  Laterality: Right;     Social History   Tobacco Use  . Smoking status: Former Smoker    Types: Cigars    Quit date: 07/28/2015    Years since quitting: 4.0  . Smokeless tobacco: Never Used  Substance Use Topics  . Alcohol use: Yes    Alcohol/week: 3.0 standard drinks    Types: 3 Glasses of wine per week  . Drug use: Not Currently     Family History  Problem Relation Age of Onset  . Stroke Mother   . COPD Brother   No bleeding or clotting disorders  No Known Allergies   REVIEW OF SYSTEMS (Negative unless checked)  Constitutional: [] Weight loss  [] Fever  [] Chills Cardiac: [] Chest pain   [] Chest pressure   [] Palpitations   [] Shortness of breath when laying flat   [] Shortness of breath at rest   [] Shortness of breath with exertion. Vascular:  [] Pain in legs with walking   [] Pain in legs at rest   [] Pain in legs when laying flat   [] Claudication   [] Pain in feet when walking  [] Pain in feet at rest  [] Pain in feet when laying flat   [] History of DVT   [] Phlebitis   [x] Swelling  in legs   [] Varicose veins   [] Non-healing ulcers Pulmonary:   [] Uses home oxygen   [] Productive cough   [] Hemoptysis   [] Wheeze  [] COPD   [] Asthma Neurologic:  [] Dizziness  [] Blackouts   [] Seizures   [] History of stroke   [] History of TIA  [] Aphasia   [] Temporary blindness   [] Dysphagia   [] Weakness or numbness in arms   [] Weakness or numbness in legs Musculoskeletal:  [x] Arthritis   [] Joint swelling   [x] Joint pain   [] Low back pain Hematologic:  [] Easy bruising  [] Easy bleeding   [] Hypercoagulable state   [] Anemic   Gastrointestinal:  [] Blood in stool   [] Vomiting blood  [] Gastroesophageal reflux/heartburn   [] Abdominal pain Genitourinary:  [] Chronic kidney disease   [] Difficult urination  [] Frequent urination  [] Burning with urination    [] Hematuria Skin:  [] Rashes   [] Ulcers   [] Wounds Psychological:  [] History of anxiety   []  History of major depression.  Physical Examination  BP 117/76   Pulse 73   Ht 6\' 1"  (1.854 m)   Wt 234 lb (106.1 kg)   BMI 30.87 kg/m  Gen:  WD/WN, NAD.  Appears younger than stated age Head: [place]/AT, No temporalis wasting. Ear/Nose/Throat: Hearing grossly intact, nares w/o erythema or drainage Eyes: Conjunctiva clear. Sclera non-icteric Neck: Supple.  Trachea midline Pulmonary:  Good air movement, no use of accessory muscles.  Cardiac: RRR, no JVD Vascular:  Vessel Right Left  Radial Palpable Palpable                          PT Palpable Palpable  DP Palpable Palpable   Gastrointestinal: soft, non-tender/non-distended. No increased aortic impulse Musculoskeletal: M/S 5/5 throughout.  No deformity or atrophy.  Trace lower extremity edema. Neurologic: Sensation grossly intact in extremities.  Symmetrical.  Speech is fluent.  Psychiatric: Judgment intact, Mood & affect appropriate for pt's clinical situation. Dermatologic: No rashes or ulcers noted.  No cellulitis or open wounds.       Labs No results found for this or any previous visit (from the past 2160 hour(s)).  Radiology No results found.  Assessment/Plan Essential hypertension blood pressure control important in reducing the progression of atherosclerotic disease. On appropriate oral medications.   Hyperlipidemia lipid control important in reducing the progression of atherosclerotic disease. Continue statin therapy  AAA (abdominal aortic aneurysm) without rupture (HCC) Noninvasive studies today demonstrate an aortic aneurysm sac measuring 3.8 cm in maximal diameter with no evidence of endoleak.  This is down from a previous study of 4.8 last year He is doing well.  We will continue to follow this on an annual basis.  No change in medical regimen.  Popliteal artery aneurysm (HCC) His popliteal aneurysm stent  graft is widely patent without stenosis on duplex today.  The size of the popliteal aneurysm was somewhere in the 1.7 to 1.8 cm range which is stable from previous studies.  We discussed medication he will continue Plavix for 1 year.  We will follow this again in 6 months with noninvasive studies.    Leotis Pain, MD  08/13/2019 9:49 AM    This note was created with Dragon medical transcription system.  Any errors from dictation are purely unintentional

## 2019-08-13 NOTE — Assessment & Plan Note (Signed)
His popliteal aneurysm stent graft is widely patent without stenosis on duplex today.  The size of the popliteal aneurysm was somewhere in the 1.7 to 1.8 cm range which is stable from previous studies.  We discussed medication he will continue Plavix for 1 year.  We will follow this again in 6 months with noninvasive studies.

## 2020-01-19 DIAGNOSIS — E782 Mixed hyperlipidemia: Secondary | ICD-10-CM | POA: Diagnosis not present

## 2020-01-26 DIAGNOSIS — I724 Aneurysm of artery of lower extremity: Secondary | ICD-10-CM | POA: Diagnosis not present

## 2020-01-26 DIAGNOSIS — N1831 Chronic kidney disease, stage 3a: Secondary | ICD-10-CM | POA: Diagnosis not present

## 2020-01-26 DIAGNOSIS — Z125 Encounter for screening for malignant neoplasm of prostate: Secondary | ICD-10-CM | POA: Diagnosis not present

## 2020-01-26 DIAGNOSIS — Z1212 Encounter for screening for malignant neoplasm of rectum: Secondary | ICD-10-CM | POA: Diagnosis not present

## 2020-01-26 DIAGNOSIS — E782 Mixed hyperlipidemia: Secondary | ICD-10-CM | POA: Diagnosis not present

## 2020-02-15 ENCOUNTER — Encounter (INDEPENDENT_AMBULATORY_CARE_PROVIDER_SITE_OTHER): Payer: PPO

## 2020-02-15 ENCOUNTER — Ambulatory Visit (INDEPENDENT_AMBULATORY_CARE_PROVIDER_SITE_OTHER): Payer: PPO | Admitting: Vascular Surgery

## 2020-03-21 ENCOUNTER — Ambulatory Visit (INDEPENDENT_AMBULATORY_CARE_PROVIDER_SITE_OTHER): Payer: PPO

## 2020-03-21 ENCOUNTER — Ambulatory Visit (INDEPENDENT_AMBULATORY_CARE_PROVIDER_SITE_OTHER): Payer: PPO | Admitting: Vascular Surgery

## 2020-03-21 ENCOUNTER — Other Ambulatory Visit: Payer: Self-pay

## 2020-03-21 ENCOUNTER — Encounter (INDEPENDENT_AMBULATORY_CARE_PROVIDER_SITE_OTHER): Payer: Self-pay | Admitting: Vascular Surgery

## 2020-03-21 VITALS — BP 116/69 | HR 68 | Resp 16 | Wt 247.0 lb

## 2020-03-21 DIAGNOSIS — I10 Essential (primary) hypertension: Secondary | ICD-10-CM

## 2020-03-21 DIAGNOSIS — I724 Aneurysm of artery of lower extremity: Secondary | ICD-10-CM

## 2020-03-21 DIAGNOSIS — I714 Abdominal aortic aneurysm, without rupture, unspecified: Secondary | ICD-10-CM

## 2020-03-21 DIAGNOSIS — E785 Hyperlipidemia, unspecified: Secondary | ICD-10-CM | POA: Diagnosis not present

## 2020-03-21 NOTE — Assessment & Plan Note (Signed)
We did a duplex today of his right lower extremity popliteal aneurysm repair which shows a patent stent graft without significant dilatation around the stent graft and no recurrent stenosis. Doing well. Continue ASA and Plavix. Recheck in 6 months and check the other side as well.

## 2020-03-21 NOTE — Assessment & Plan Note (Signed)
To be checked again in about 5-6 months.

## 2020-03-21 NOTE — Progress Notes (Signed)
MRN : [id number]  Jeffrey Salas is a 69 y.o. (06-Jul-1950) male who presents with chief complaint of  Chief Complaint  Patient presents with  . Follow-up    6 month ultrasound follow up  .  History of Present Illness: Patient returns today in follow up of his popliteal aneurysms.  He is about 10 months status post repair of his right popliteal aneurysm with a stent graft.  He has had a left popliteal aneurysm that is below the threshold for repair we have been following.  We did a duplex today of his right lower extremity popliteal aneurysm repair which shows a patent stent graft without significant dilatation around the stent graft and no recurrent stenosis. He is also s/p endovascular AAA repair several years ago.  Due to be checked in the spring.   Current Outpatient Medications  Medication Sig Dispense Refill  . aspirin 81 MG chewable tablet Chew 81 mg by mouth daily.    Marland Kitchen atorvastatin (LIPITOR) 20 MG tablet Take 20 mg by mouth daily.    . carvedilol (COREG) 25 MG tablet Take 25 mg by mouth 2 (two) times daily with a meal.    . clopidogrel (PLAVIX) 75 MG tablet Take 1 tablet (75 mg total) by mouth daily. 30 tablet 11  . hydrALAZINE (APRESOLINE) 10 MG tablet Take 20 mg by mouth 3 (three) times daily.    Marland Kitchen olmesartan (BENICAR) 20 MG tablet Take 20 mg by mouth daily.    . vitamin B-12 (CYANOCOBALAMIN) 1000 MCG tablet Take 1,000 mcg by mouth daily.     No current facility-administered medications for this visit.    Past Medical History:  Diagnosis Date  . AAA (abdominal aortic aneurysm) ([place])   . Degenerative disc disease, lumbar   . Hyperlipidemia   . Hypertension     Past Surgical History:  Procedure Laterality Date  . DESCENDING AORTIC ANEURYSM REPAIR W/ STENT    . LOWER EXTREMITY ANGIOGRAPHY Right 04/19/2019   Procedure: LOWER EXTREMITY ANGIOGRAPHY;  Surgeon: Algernon Huxley, MD;  Location: [facility];  Service: Cardiovascular;  Laterality: Right;     Social  History   Tobacco Use  . Smoking status: Former Smoker    Types: Cigars    Quit date: 07/28/2015    Years since quitting: 4.6  . Smokeless tobacco: Never Used  Vaping Use  . Vaping Use: Never used  Substance Use Topics  . Alcohol use: Yes    Alcohol/week: 3.0 standard drinks    Types: 3 Glasses of wine per week  . Drug use: Not Currently      Family History  Problem Relation Age of Onset  . Stroke Mother   . COPD Brother   no bleeding or clotting disorders  No Known Allergies  REVIEW OF SYSTEMS (Negative unless checked)  Constitutional: [] ?Weight loss  [] ?Fever  [] ?Chills Cardiac: [] ?Chest pain   [] ?Chest pressure   [] ?Palpitations   [] ?Shortness of breath when laying flat   [] ?Shortness of breath at rest   [] ?Shortness of breath with exertion. Vascular:  [] ?Pain in legs with walking   [] ?Pain in legs at rest   [] ?Pain in legs when laying flat   [] ?Claudication   [] ?Pain in feet when walking  [] ?Pain in feet at rest  [] ?Pain in feet when laying flat   [] ?History of DVT   [] ?Phlebitis   [x] ?Swelling in legs   [] ?Varicose veins   [] ?Non-healing ulcers Pulmonary:   [] ?Uses home oxygen   [] ?Productive cough   [] ?  Hemoptysis   [] ?Wheeze  [] ?COPD   [] ?Asthma Neurologic:  [] ?Dizziness  [] ?Blackouts   [] ?Seizures   [] ?History of stroke   [] ?History of TIA  [] ?Aphasia   [] ?Temporary blindness   [] ?Dysphagia   [] ?Weakness or numbness in arms   [] ?Weakness or numbness in legs Musculoskeletal:  [x] ?Arthritis   [] ?Joint swelling   [x] ?Joint pain   [] ?Low back pain Hematologic:  [] ?Easy bruising  [] ?Easy bleeding   [] ?Hypercoagulable state   [] ?Anemic   Gastrointestinal:  [] ?Blood in stool   [] ?Vomiting blood  [] ?Gastroesophageal reflux/heartburn   [] ?Abdominal pain Genitourinary:  [] ?Chronic kidney disease   [] ?Difficult urination  [] ?Frequent urination  [] ?Burning with urination   [] ?Hematuria Skin:  [] ?Rashes   [] ?Ulcers   [] ?Wounds Psychological:  [] ?History of anxiety   [] ? History of  major depression.  Physical Examination  BP 116/69 (BP Location: Right Arm)   Pulse 68   Resp 16   Wt 247 lb (112 kg)   BMI 32.59 kg/m  Gen:  WD/WN, NAD. Appears younger than stated age. Head: /AT, No temporalis wasting. Ear/Nose/Throat: Hearing grossly intact, nares w/o erythema or drainage Eyes: Conjunctiva clear. Sclera non-icteric Neck: Supple.  Trachea midline Pulmonary:  Good air movement, no use of accessory muscles.  Cardiac: RRR, no JVD Vascular:  Vessel Right Left  Radial Palpable Palpable                          PT Palpable Palpable  DP Palpable Palpable    Musculoskeletal: M/S 5/5 throughout.  No deformity or atrophy. No edema. Neurologic: Sensation grossly intact in extremities.  Symmetrical.  Speech is fluent.  Psychiatric: Judgment intact, Mood & affect appropriate for pt's clinical situation. Dermatologic: No rashes or ulcers noted.  No cellulitis or open wounds.       Labs No results found for this or any previous visit (from the past 2160 hour(s)).  Radiology No results found.  Assessment/Plan Essential hypertension blood pressure control important in reducing the progression of atherosclerotic disease. On appropriate oral medications.   Hyperlipidemia lipid control important in reducing the progression of atherosclerotic disease. Continue statin therapy  AAA (abdominal aortic aneurysm) without rupture ([place]) To be checked again in about 5-6 months.  Popliteal artery aneurysm ([facility]) We did a duplex today of his right lower extremity popliteal aneurysm repair which shows a patent stent graft without significant dilatation around the stent graft and no recurrent stenosis. Doing well. Continue ASA and Plavix. Recheck in 6 months and check the other side as well.    Leotis Pain, MD  03/21/2020 9:17 AM    This note was created with Dragon medical transcription system.  Any errors from dictation are purely unintentional

## 2020-04-05 ENCOUNTER — Other Ambulatory Visit (INDEPENDENT_AMBULATORY_CARE_PROVIDER_SITE_OTHER): Payer: Self-pay | Admitting: Vascular Surgery

## 2020-04-25 DIAGNOSIS — B349 Viral infection, unspecified: Secondary | ICD-10-CM | POA: Diagnosis not present

## 2020-07-25 DIAGNOSIS — Z125 Encounter for screening for malignant neoplasm of prostate: Secondary | ICD-10-CM | POA: Diagnosis not present

## 2020-07-25 DIAGNOSIS — E782 Mixed hyperlipidemia: Secondary | ICD-10-CM | POA: Diagnosis not present

## 2020-07-25 DIAGNOSIS — I724 Aneurysm of artery of lower extremity: Secondary | ICD-10-CM | POA: Diagnosis not present

## 2020-07-25 DIAGNOSIS — N1831 Chronic kidney disease, stage 3a: Secondary | ICD-10-CM | POA: Diagnosis not present

## 2020-08-01 DIAGNOSIS — N1832 Chronic kidney disease, stage 3b: Secondary | ICD-10-CM | POA: Diagnosis not present

## 2020-08-01 DIAGNOSIS — E782 Mixed hyperlipidemia: Secondary | ICD-10-CM | POA: Diagnosis not present

## 2020-08-01 DIAGNOSIS — Z0000 Encounter for general adult medical examination without abnormal findings: Secondary | ICD-10-CM | POA: Diagnosis not present

## 2020-08-01 DIAGNOSIS — I724 Aneurysm of artery of lower extremity: Secondary | ICD-10-CM | POA: Diagnosis not present

## 2020-08-01 DIAGNOSIS — R972 Elevated prostate specific antigen [PSA]: Secondary | ICD-10-CM | POA: Insufficient documentation

## 2020-08-14 ENCOUNTER — Other Ambulatory Visit (INDEPENDENT_AMBULATORY_CARE_PROVIDER_SITE_OTHER): Payer: Self-pay | Admitting: Vascular Surgery

## 2020-08-14 DIAGNOSIS — I714 Abdominal aortic aneurysm, without rupture, unspecified: Secondary | ICD-10-CM

## 2020-08-15 ENCOUNTER — Encounter (INDEPENDENT_AMBULATORY_CARE_PROVIDER_SITE_OTHER): Payer: Self-pay | Admitting: Vascular Surgery

## 2020-08-15 ENCOUNTER — Ambulatory Visit (INDEPENDENT_AMBULATORY_CARE_PROVIDER_SITE_OTHER): Payer: PPO

## 2020-08-15 ENCOUNTER — Ambulatory Visit (INDEPENDENT_AMBULATORY_CARE_PROVIDER_SITE_OTHER): Payer: PPO | Admitting: Vascular Surgery

## 2020-08-15 ENCOUNTER — Other Ambulatory Visit: Payer: Self-pay

## 2020-08-15 VITALS — BP 144/84 | HR 60 | Resp 16 | Wt 230.0 lb

## 2020-08-15 DIAGNOSIS — I714 Abdominal aortic aneurysm, without rupture, unspecified: Secondary | ICD-10-CM

## 2020-08-15 DIAGNOSIS — E785 Hyperlipidemia, unspecified: Secondary | ICD-10-CM | POA: Diagnosis not present

## 2020-08-15 DIAGNOSIS — N183 Chronic kidney disease, stage 3 unspecified: Secondary | ICD-10-CM

## 2020-08-15 DIAGNOSIS — I724 Aneurysm of artery of lower extremity: Secondary | ICD-10-CM

## 2020-08-15 DIAGNOSIS — I10 Essential (primary) hypertension: Secondary | ICD-10-CM | POA: Diagnosis not present

## 2020-08-15 NOTE — Assessment & Plan Note (Signed)
Duplex shows slight enlargement of his aortic sac but no obvious endoleak with a maximal sac diameter and his aorta of 4.6 cm.  Doing okay.  Continue to follow in 1 year.

## 2020-08-15 NOTE — Progress Notes (Signed)
MRN : [id number]  Jeffrey Salas is a 70 y.o. (01/05/1951) male who presents with chief complaint of  Chief Complaint  Patient presents with  . Follow-up    6 month ultrasound follow up  .  History of Present Illness: Patient returns today in follow up of multiple vascular issues.  He is status post endovascular abdominal aortic aneurysm repair and a right popliteal aneurysm repair with a stent graft in the past.  He is doing well and has no aneurysm complaints.  No new problems or symptoms today.  Duplex shows slight enlargement of his aortic sac but no obvious endoleak with a maximal sac diameter and his aorta of 4.6 cm. He is status post popliteal aneurysm repair a little less than 2 years ago on the right.  Small left popliteal aneurysm we have followed.  No new symptoms. Right popliteal aneurysm repaired with stent graft and widely patent on duplex today.  No significant enlargement around the stent graft.  Left popliteal artery measures 1.1 cm in maximal diameter which is stable.  Current Outpatient Medications  Medication Sig Dispense Refill  . atorvastatin (LIPITOR) 20 MG tablet Take 20 mg by mouth daily.    Marland Kitchen atorvastatin (LIPITOR) 20 MG tablet Take 20 mg by mouth daily.    . carvedilol (COREG) 25 MG tablet Take 25 mg by mouth 2 (two) times daily with a meal.    . clopidogrel (PLAVIX) 75 MG tablet TAKE ONE TABLET BY MOUTH DAILY 90 tablet 4  . hydrALAZINE (APRESOLINE) 10 MG tablet Take 20 mg by mouth 3 (three) times daily.    Marland Kitchen olmesartan (BENICAR) 20 MG tablet Take 20 mg by mouth daily.    . vitamin B-12 (CYANOCOBALAMIN) 1000 MCG tablet Take 1,000 mcg by mouth daily.    Marland Kitchen aspirin 81 MG chewable tablet Chew 81 mg by mouth daily. (Patient not taking: Reported on 08/15/2020)     No current facility-administered medications for this visit.    Past Medical History:  Diagnosis Date  . AAA (abdominal aortic aneurysm) ([place])   . Degenerative disc disease, lumbar   . Hyperlipidemia   .  Hypertension     Past Surgical History:  Procedure Laterality Date  . DESCENDING AORTIC ANEURYSM REPAIR W/ STENT    . LOWER EXTREMITY ANGIOGRAPHY Right 04/19/2019   Procedure: LOWER EXTREMITY ANGIOGRAPHY;  Surgeon: Algernon Huxley, MD;  Location: [facility];  Service: Cardiovascular;  Laterality: Right;     Social History        Tobacco Use  . Smoking status: Former Smoker    Types: Cigars    Quit date: 07/28/2015    Years since quitting: 4.6  . Smokeless tobacco: Never Used  Vaping Use  . Vaping Use: Never used  Substance Use Topics  . Alcohol use: Yes    Alcohol/week: 3.0 standard drinks    Types: 3 Glasses of wine per week  . Drug use: Not Currently           Family History  Problem Relation Age of Onset  . Stroke Mother   . COPD Brother   no bleeding or clotting disorders  No Known Allergies  REVIEW OF SYSTEMS(Negative unless checked)  Constitutional: [] ??Weight loss [] ??Fever [] ??Chills Cardiac: [] ??Chest pain [] ??Chest pressure [] ??Palpitations [] ??Shortness of breath when laying flat [] ??Shortness of breath at rest [] ??Shortness of breath with exertion. Vascular: [] ??Pain in legs with walking [] ??Pain in legs at rest [] ??Pain in legs when laying flat [] ??Claudication [] ??Pain in feet  when walking [] ??Pain in feet at rest [] ??Pain in feet when laying flat [] ??History of DVT [] ??Phlebitis [x] ??Swelling in legs [] ??Varicose veins [] ??Non-healing ulcers Pulmonary: [] ??Uses home oxygen [] ??Productive cough [] ??Hemoptysis [] ??Wheeze [] ??COPD [] ??Asthma Neurologic: [] ??Dizziness [] ??Blackouts [] ??Seizures [] ??History of stroke [] ??History of TIA [] ??Aphasia [] ??Temporary blindness [] ??Dysphagia [] ??Weakness or numbness in arms [] ??Weakness or numbness in legs Musculoskeletal: [x] ??Arthritis [] ??Joint swelling [x] ??Joint pain [] ??Low back pain Hematologic: [] ??Easy bruising  [] ??Easy bleeding [] ??Hypercoagulable state [] ??Anemic  Gastrointestinal: [] ??Blood in stool [] ??Vomiting blood [] ??Gastroesophageal reflux/heartburn [] ??Abdominal pain Genitourinary: [] ??Chronic kidney disease [] ??Difficult urination [] ??Frequent urination [] ??Burning with urination [] ??Hematuria Skin: [] ??Rashes [] ??Ulcers [] ??Wounds Psychological: [] ??History of anxiety [] ??History of major depression.    Physical Examination  BP (!) 144/84 (BP Location: Right Arm)   Pulse 60   Resp 16   Wt 230 lb (104.3 kg)   BMI 30.34 kg/m  Gen:  WD/WN, NAD Head: [place]/AT, No temporalis wasting. Ear/Nose/Throat: Hearing grossly intact, nares w/o erythema or drainage Eyes: Conjunctiva clear. Sclera non-icteric Neck: Supple.  Trachea midline Pulmonary:  Good air movement, no use of accessory muscles.  Cardiac: RRR, no JVD Vascular:  Vessel Right Left  Radial Palpable Palpable                          PT Palpable Palpable  DP Palpable Palpable   Gastrointestinal: soft, non-tender/non-distended. No guarding/reflex.  Musculoskeletal: M/S 5/5 throughout.  No deformity or atrophy. No edema. Neurologic: Sensation grossly intact in extremities.  Symmetrical.  Speech is fluent.  Psychiatric: Judgment intact, Mood & affect appropriate for pt's clinical situation. Dermatologic: No rashes or ulcers noted.  No cellulitis or open wounds.       Labs No results found for this or any previous visit (from the past 2160 hour(s)).  Radiology VAS [place] EVAR DUPLEX  Result Date: 08/15/2020 Endovascular Aortic Repair Study (EVAR) Patient Name:  Jeffrey Salas  Date of Exam:   08/15/2020 Medical Rec #: [id number]      Accession #:    [id number] Date of Birth: 06/08/1950       Patient Gender: M Patient Age:   69Y Exam Location:  [facility] Procedure:      VAS [place] EVAR DUPLEX Referring Phys: [place]  --------------------------------------------------------------------------------  Indications: Follow up exam for EVAR.  Comparison Study: 08/13/2019 Performing Technologist: Almira Coaster RVS  Examination Guidelines: A complete evaluation includes B-mode imaging, spectral Doppler, color Doppler, and power Doppler as needed of all accessible portions of each vessel. Bilateral testing is considered an integral part of a complete examination. Limited examinations for reoccurring indications may be performed as noted.  Endovascular Aortic Repair (EVAR): +----------+----------------+-------------------+-------------------+           Diameter AP (cm)Diameter Trans (cm)Velocities (cm/sec) +----------+----------------+-------------------+-------------------+ Aorta     3.91            4.60               73                  +----------+----------------+-------------------+-------------------+ Right Limb1.34            1.22               84                  +----------+----------------+-------------------+-------------------+ Left Limb 1.49            1.55  94                  +----------+----------------+-------------------+-------------------+  Summary: Abdominal Aorta: Patent endovascular aneurysm repair with no evidence of endoleak. The EVAR diameter appears to be increased compared to prior study on 08/13/2019; from 3.79cms to 4.60cms. There appears to be no Endoleak.  *See table(s) above for measurements and observations.  Electronically signed by Leotis Pain MD on 08/15/2020 at 8:35:48 AM.   Final    VAS [place] LOWER EXTREMITY ARTERIAL DUPLEX  Result Date: 08/15/2020 LOWER EXTREMITY ARTERIAL DUPLEX STUDY Patient Name:  DIVON KRABILL  Date of Exam:   08/15/2020 Medical Rec #: [id number]      Accession #:    [id number] Date of Birth: 1950-07-12       Patient Gender: M Patient Age:   69Y [facility] Procedure:      VAS [place] LOWER EXTREMITY ARTERIAL DUPLEX Referring  Phys: [id number] [place] --------------------------------------------------------------------------------  Indications: Popliteal Aneurysm.  Current ABI: Not obtained Comparison Study: 03/21/2020 Performing Technologist: Almira Coaster RVS  Examination Guidelines: A complete evaluation includes B-mode imaging, spectral Doppler, color Doppler, and power Doppler as needed of all accessible portions of each vessel. Bilateral testing is considered an integral part of a complete examination. Limited examinations for reoccurring indications may be performed as noted.  +---------------+-------+-----------+---------+--------+-----+--------+ Right PoplitealAP (cm)Transv (cm)Waveform StenosisShapeComments +---------------+-------+-----------+---------+--------+-----+--------+ Mid            1.00   1.14       triphasic                      +---------------+-------+-----------+---------+--------+-----+--------+ Distal         0.91   0.96       triphasic                      +---------------+-------+-----------+---------+--------+-----+--------+ +--------------+-------+-----------+--------+--------+-----+--------+ Left PoplitealAP (cm)Transv (cm)WaveformStenosisShapeComments +--------------+-------+-----------+--------+--------+-----+--------+ Mid           1.05   1.05       biphasic                      +--------------+-------+-----------+--------+--------+-----+--------+ Distal        0.86   0.91       biphasic                      +--------------+-------+-----------+--------+--------+-----+--------+  Summary: Right: The Right Popliteal Artery appears to be Widely patent and appears Normal; Stent is patent as well. Left: The Left Popliteal Artery appears to be normal; Biphasic Waveforms seen.  See table(s) above for measurements and observations. Electronically signed by Leotis Pain MD on 08/15/2020 at 8:34:48 AM.    Final     Assessment/Plan Essential hypertension blood  pressure control important in reducing the progression of atherosclerotic disease. On appropriate oral medications.   Hyperlipidemia lipid control important in reducing the progression of atherosclerotic disease. Continue statin therapy  AAA (abdominal aortic aneurysm) without rupture ([facility]) Duplex shows slight enlargement of his aortic sac but no obvious endoleak with a maximal sac diameter and his aorta of 4.6 cm.  Doing okay.  Continue to follow in 1 year.  Popliteal artery aneurysm ([facility]) Right popliteal aneurysm repaired with stent graft and widely patent on duplex today.  No significant enlargement around the stent graft.  Left popliteal artery measures 1.1 cm in maximal diameter which is stable.  Follow-up in 1 year.  No change in  medical regimen.    Leotis Pain, MD  08/15/2020 11:14 AM    This note was created with Dragon medical transcription system.  Any errors from dictation are purely unintentional

## 2020-08-15 NOTE — Assessment & Plan Note (Signed)
Right popliteal aneurysm repaired with stent graft and widely patent on duplex today.  No significant enlargement around the stent graft.  Left popliteal artery measures 1.1 cm in maximal diameter which is stable.  Follow-up in 1 year.  No change in medical regimen.

## 2020-08-31 DIAGNOSIS — N1832 Chronic kidney disease, stage 3b: Secondary | ICD-10-CM | POA: Diagnosis not present

## 2020-12-13 DIAGNOSIS — L03119 Cellulitis of unspecified part of limb: Secondary | ICD-10-CM | POA: Diagnosis not present

## 2020-12-13 DIAGNOSIS — N1831 Chronic kidney disease, stage 3a: Secondary | ICD-10-CM | POA: Diagnosis not present

## 2021-01-03 DIAGNOSIS — L03116 Cellulitis of left lower limb: Secondary | ICD-10-CM | POA: Diagnosis not present

## 2021-01-03 DIAGNOSIS — L03115 Cellulitis of right lower limb: Secondary | ICD-10-CM | POA: Diagnosis not present

## 2021-01-03 DIAGNOSIS — I739 Peripheral vascular disease, unspecified: Secondary | ICD-10-CM | POA: Diagnosis not present

## 2021-01-11 DIAGNOSIS — H353132 Nonexudative age-related macular degeneration, bilateral, intermediate dry stage: Secondary | ICD-10-CM | POA: Diagnosis not present

## 2021-01-11 DIAGNOSIS — H26491 Other secondary cataract, right eye: Secondary | ICD-10-CM | POA: Diagnosis not present

## 2021-01-12 DIAGNOSIS — H26491 Other secondary cataract, right eye: Secondary | ICD-10-CM | POA: Diagnosis not present

## 2021-01-29 ENCOUNTER — Other Ambulatory Visit (INDEPENDENT_AMBULATORY_CARE_PROVIDER_SITE_OTHER): Payer: Self-pay | Admitting: Nurse Practitioner

## 2021-01-29 DIAGNOSIS — R0989 Other specified symptoms and signs involving the circulatory and respiratory systems: Secondary | ICD-10-CM

## 2021-01-30 ENCOUNTER — Encounter (INDEPENDENT_AMBULATORY_CARE_PROVIDER_SITE_OTHER): Payer: Self-pay | Admitting: Vascular Surgery

## 2021-01-30 ENCOUNTER — Ambulatory Visit (INDEPENDENT_AMBULATORY_CARE_PROVIDER_SITE_OTHER): Payer: PPO

## 2021-01-30 ENCOUNTER — Ambulatory Visit (INDEPENDENT_AMBULATORY_CARE_PROVIDER_SITE_OTHER): Payer: PPO | Admitting: Vascular Surgery

## 2021-01-30 ENCOUNTER — Other Ambulatory Visit: Payer: Self-pay

## 2021-01-30 VITALS — BP 123/71 | HR 61 | Ht 73.0 in | Wt 240.0 lb

## 2021-01-30 DIAGNOSIS — I10 Essential (primary) hypertension: Secondary | ICD-10-CM | POA: Diagnosis not present

## 2021-01-30 DIAGNOSIS — R0989 Other specified symptoms and signs involving the circulatory and respiratory systems: Secondary | ICD-10-CM | POA: Diagnosis not present

## 2021-01-30 DIAGNOSIS — R972 Elevated prostate specific antigen [PSA]: Secondary | ICD-10-CM | POA: Diagnosis not present

## 2021-01-30 DIAGNOSIS — I7143 Infrarenal abdominal aortic aneurysm, without rupture: Secondary | ICD-10-CM | POA: Diagnosis not present

## 2021-01-30 DIAGNOSIS — E785 Hyperlipidemia, unspecified: Secondary | ICD-10-CM

## 2021-01-30 DIAGNOSIS — E782 Mixed hyperlipidemia: Secondary | ICD-10-CM | POA: Diagnosis not present

## 2021-01-30 DIAGNOSIS — L97211 Non-pressure chronic ulcer of right calf limited to breakdown of skin: Secondary | ICD-10-CM

## 2021-01-30 DIAGNOSIS — I724 Aneurysm of artery of lower extremity: Secondary | ICD-10-CM | POA: Diagnosis not present

## 2021-01-30 DIAGNOSIS — N1832 Chronic kidney disease, stage 3b: Secondary | ICD-10-CM | POA: Diagnosis not present

## 2021-01-30 NOTE — Assessment & Plan Note (Signed)
To evaluate his perfusion for wound healing ABIs were done today which were normal at 1.39 on the right and 1.4 on the left.  Waveforms were brisk and digital pressures were normal. I think the best option for him today would be placing him in an Unna boot and changing this weekly for the next couple of weeks to get the wounds healed, the inflammation under control, and the swelling down.  Following this, compression socks can likely be used once the wound is healed.  We will place a 3-layer Unna boot today and this will be changed weekly for the next 2 to 3 weeks.

## 2021-01-30 NOTE — Assessment & Plan Note (Signed)
Previous repair appears patent with normal flow distally.

## 2021-01-30 NOTE — Assessment & Plan Note (Signed)
Has had a previous endovascular repair which would make embolization far less likely.

## 2021-01-30 NOTE — Progress Notes (Signed)
MRN : [id number]  Jeffrey Salas is a 70 y.o. ([birth year]) male who presents with chief complaint of  Chief Complaint  Patient presents with   Follow-up    Abi + consult last seen 08-15-20 BIL leg cellulitis decreased pulse in rt foot  possible ischemic. Referred by Emily Filbert   .  History of Present Illness: Patient returns today in follow up prior to his scheduled visit due to ulceration and recurrent cellulitis of the right lower extremity.  Associated with some swelling.  Just in the last few days, he has developed a small ulceration on the medial lower leg.  He has recently been treated for cellulitis with antibiotics but the redness has recurred.  He has a previous history of right popliteal stent placement for popliteal aneurysm.  He is also had abdominal aortic aneurysm which we have been following.  To evaluate his perfusion for wound healing ABIs were done today which were normal at 1.39 on the right and 1.4 on the left.  Waveforms were brisk and digital pressures were normal.  Current Outpatient Medications  Medication Sig Dispense Refill   aspirin 81 MG chewable tablet Chew 81 mg by mouth daily.     atorvastatin (LIPITOR) 20 MG tablet Take 20 mg by mouth daily.     atorvastatin (LIPITOR) 20 MG tablet Take 20 mg by mouth daily.     carvedilol (COREG) 25 MG tablet Take 25 mg by mouth 2 (two) times daily with a meal.     clopidogrel (PLAVIX) 75 MG tablet TAKE ONE TABLET BY MOUTH DAILY 90 tablet 4   furosemide (LASIX) 20 MG tablet Take by mouth.     hydrALAZINE (APRESOLINE) 10 MG tablet Take 20 mg by mouth 3 (three) times daily.     olmesartan (BENICAR) 20 MG tablet Take 20 mg by mouth daily.     triamcinolone cream (KENALOG) 0.1 % Apply topically 2 (two) times daily.     vitamin B-12 (CYANOCOBALAMIN) 1000 MCG tablet Take 1,000 mcg by mouth daily.     No current facility-administered medications for this visit.    Past Medical History:  Diagnosis Date   AAA (abdominal aortic  aneurysm)    Degenerative disc disease, lumbar    Hyperlipidemia    Hypertension     Past Surgical History:  Procedure Laterality Date   DESCENDING AORTIC ANEURYSM REPAIR W/ STENT     LOWER EXTREMITY ANGIOGRAPHY Right 04/19/2019   Procedure: LOWER EXTREMITY ANGIOGRAPHY;  Surgeon: Algernon Huxley, MD;  Location: [facility];  Service: Cardiovascular;  Laterality: Right;    Social History             Tobacco Use   Smoking status: Former Smoker      Types: Cigars      Quit date: 07/28/2015      Years since quitting: 4.6   Smokeless tobacco: Never Used  Vaping Use   Vaping Use: Never used  Substance Use Topics   Alcohol use: Yes      Alcohol/week: 3.0 standard drinks      Types: 3 Glasses of wine per week   Drug use: Not Currently                   Family History  Problem Relation Age of Onset   Stroke Mother     COPD Brother    no bleeding or clotting disorders   No Known Allergies   REVIEW OF SYSTEMS (Negative unless checked)  Constitutional: [] Weight loss  [] Fever  [] Chills Cardiac: [] Chest pain   [] Chest pressure   [] Palpitations   [] Shortness of breath when laying flat   [] Shortness of breath at rest   [] Shortness of breath with exertion. Vascular:  [] Pain in legs with walking   [] Pain in legs at rest   [] Pain in legs when laying flat   [] Claudication   [] Pain in feet when walking  [] Pain in feet at rest  [] Pain in feet when laying flat   [] History of DVT   [] Phlebitis   [x] Swelling in legs   [] Varicose veins   [] Non-healing ulcers Pulmonary:   [] Uses home oxygen   [] Productive cough   [] Hemoptysis   [] Wheeze  [] COPD   [] Asthma Neurologic:  [] Dizziness  [] Blackouts   [] Seizures   [] History of stroke   [] History of TIA  [] Aphasia   [] Temporary blindness   [] Dysphagia   [] Weakness or numbness in arms   [] Weakness or numbness in legs Musculoskeletal:  [x] Arthritis   [] Joint swelling   [x] Joint pain   [] Low back pain Hematologic:  [] Easy bruising  [] Easy bleeding    [] Hypercoagulable state   [] Anemic   Gastrointestinal:  [] Blood in stool   [] Vomiting blood  [] Gastroesophageal reflux/heartburn   [] Abdominal pain Genitourinary:  [] Chronic kidney disease   [] Difficult urination  [] Frequent urination  [] Burning with urination   [] Hematuria Skin:  [] Rashes   [] Ulcers   [] Wounds Psychological:  [] History of anxiety   []  History of major depression.     Physical Examination  BP 123/71   Pulse 61   Ht 6\' 1"  (1.854 m)   Wt 240 lb (108.9 kg)   BMI 31.66 kg/m  Gen:  WD/WN, NAD.  Appears younger than stated age. Head: [place]/AT, No temporalis wasting. Ear/Nose/Throat: Hearing grossly intact, nares w/o erythema or drainage Eyes: Conjunctiva clear. Sclera non-icteric Neck: Supple.  Trachea midline Pulmonary:  Good air movement, no use of accessory muscles.  Cardiac: RRR, no JVD Vascular:  Vessel Right Left  Radial Palpable Palpable                          PT 1+ palpable 1+ palpable  DP 1+ palpable 2+ palpable   Musculoskeletal: M/S 5/5 throughout.  No deformity or atrophy.  Small ulceration on the medial right lower leg with mild surrounding erythema.  1-2+ right lower extremity edema.  Trace left lower extremity edema Neurologic: Sensation grossly intact in extremities.  Symmetrical.  Speech is fluent.  Psychiatric: Judgment intact, Mood & affect appropriate for pt's clinical situation. Dermatologic: Right lower leg wound as above.      Labs No results found for this or any previous visit (from the past 2160 hour(s)).  Radiology No results found.  Assessment/Plan Essential hypertension blood pressure control important in reducing the progression of atherosclerotic disease. On appropriate oral medications.     Hyperlipidemia lipid control important in reducing the progression of atherosclerotic disease. Continue statin therapy  AAA (abdominal aortic aneurysm) without rupture (HCC) Has had a previous endovascular repair which would make  embolization far less likely.  Popliteal artery aneurysm (HCC) Previous repair appears patent with normal flow distally.  Lower limb ulcer, calf, right, limited to breakdown of skin ([place]) To evaluate his perfusion for wound healing ABIs were done today which were normal at 1.39 on the right and 1.4 on the left.  Waveforms were brisk and digital pressures were normal. I think the best option for him today  would be placing him in an Unna boot and changing this weekly for the next couple of weeks to get the wounds healed, the inflammation under control, and the swelling down.  Following this, compression socks can likely be used once the wound is healed.  We will place a 3-layer Unna boot today and this will be changed weekly for the next 2 to 3 weeks.    Leotis Pain, MD  01/30/2021 10:58 AM    This note was created with Dragon medical transcription system.  Any errors from dictation are purely unintentional

## 2021-02-06 ENCOUNTER — Other Ambulatory Visit: Payer: Self-pay

## 2021-02-06 ENCOUNTER — Ambulatory Visit (INDEPENDENT_AMBULATORY_CARE_PROVIDER_SITE_OTHER): Payer: PPO | Admitting: Nurse Practitioner

## 2021-02-06 ENCOUNTER — Encounter (INDEPENDENT_AMBULATORY_CARE_PROVIDER_SITE_OTHER): Payer: Self-pay

## 2021-02-06 VITALS — BP 116/75 | HR 60 | Resp 16 | Wt 242.2 lb

## 2021-02-06 DIAGNOSIS — L97211 Non-pressure chronic ulcer of right calf limited to breakdown of skin: Secondary | ICD-10-CM | POA: Diagnosis not present

## 2021-02-06 DIAGNOSIS — E538 Deficiency of other specified B group vitamins: Secondary | ICD-10-CM | POA: Diagnosis not present

## 2021-02-06 DIAGNOSIS — R739 Hyperglycemia, unspecified: Secondary | ICD-10-CM | POA: Diagnosis not present

## 2021-02-06 DIAGNOSIS — L03116 Cellulitis of left lower limb: Secondary | ICD-10-CM | POA: Diagnosis not present

## 2021-02-06 DIAGNOSIS — Z1212 Encounter for screening for malignant neoplasm of rectum: Secondary | ICD-10-CM | POA: Diagnosis not present

## 2021-02-06 DIAGNOSIS — N1832 Chronic kidney disease, stage 3b: Secondary | ICD-10-CM | POA: Diagnosis not present

## 2021-02-06 DIAGNOSIS — L03115 Cellulitis of right lower limb: Secondary | ICD-10-CM | POA: Diagnosis not present

## 2021-02-06 DIAGNOSIS — Z125 Encounter for screening for malignant neoplasm of prostate: Secondary | ICD-10-CM | POA: Diagnosis not present

## 2021-02-06 DIAGNOSIS — E782 Mixed hyperlipidemia: Secondary | ICD-10-CM | POA: Diagnosis not present

## 2021-02-06 NOTE — Progress Notes (Signed)
History of Present Illness  There is no documented history at this time  Assessments & Plan   There are no diagnoses linked to this encounter.    Additional instructions  Subjective:  Patient presents with venous ulcer of the Right lower extremity.    Procedure:  3 layer unna wrap was placed Right lower extremity.   Plan:   Follow up in one week.   

## 2021-02-11 ENCOUNTER — Encounter (INDEPENDENT_AMBULATORY_CARE_PROVIDER_SITE_OTHER): Payer: Self-pay | Admitting: Nurse Practitioner

## 2021-02-13 ENCOUNTER — Encounter (INDEPENDENT_AMBULATORY_CARE_PROVIDER_SITE_OTHER): Payer: Self-pay

## 2021-02-13 ENCOUNTER — Ambulatory Visit (INDEPENDENT_AMBULATORY_CARE_PROVIDER_SITE_OTHER): Payer: PPO | Admitting: Nurse Practitioner

## 2021-02-13 ENCOUNTER — Other Ambulatory Visit: Payer: Self-pay

## 2021-02-13 VITALS — BP 115/72 | HR 59 | Resp 16 | Wt 239.0 lb

## 2021-02-13 DIAGNOSIS — L97211 Non-pressure chronic ulcer of right calf limited to breakdown of skin: Secondary | ICD-10-CM

## 2021-02-13 NOTE — Progress Notes (Signed)
Patient will be coming out of unna wraps today, The patient has no open wounds or swelling. I spoke with Dr Lucky Cowboy and he advise for the patient to return in 1 month for follow up with no studies.

## 2021-02-26 ENCOUNTER — Other Ambulatory Visit: Payer: Self-pay

## 2021-02-26 ENCOUNTER — Ambulatory Visit (INDEPENDENT_AMBULATORY_CARE_PROVIDER_SITE_OTHER): Payer: PPO | Admitting: Nurse Practitioner

## 2021-02-26 ENCOUNTER — Encounter (INDEPENDENT_AMBULATORY_CARE_PROVIDER_SITE_OTHER): Payer: Self-pay

## 2021-02-26 VITALS — BP 108/70 | HR 73 | Resp 16 | Wt 239.6 lb

## 2021-02-26 DIAGNOSIS — L97211 Non-pressure chronic ulcer of right calf limited to breakdown of skin: Secondary | ICD-10-CM

## 2021-02-26 NOTE — Progress Notes (Signed)
History of Present Illness  There is no documented history at this time  Assessments & Plan   There are no diagnoses linked to this encounter.    Additional instructions  Subjective:  Patient presents with venous ulcer of the Right lower extremity.    Procedure:  3 layer unna wrap was placed Right lower extremity.   Plan:   Follow up in one week.   

## 2021-03-05 ENCOUNTER — Other Ambulatory Visit: Payer: Self-pay

## 2021-03-05 ENCOUNTER — Ambulatory Visit (INDEPENDENT_AMBULATORY_CARE_PROVIDER_SITE_OTHER): Payer: PPO | Admitting: Nurse Practitioner

## 2021-03-05 VITALS — BP 100/65 | HR 58 | Ht 73.0 in | Wt 238.0 lb

## 2021-03-05 DIAGNOSIS — L97211 Non-pressure chronic ulcer of right calf limited to breakdown of skin: Secondary | ICD-10-CM

## 2021-03-05 NOTE — Progress Notes (Signed)
Pt came out of wraps didn't feel he need he need them any more he was instructed to wear compression and come to his follow up.

## 2021-03-11 ENCOUNTER — Encounter (INDEPENDENT_AMBULATORY_CARE_PROVIDER_SITE_OTHER): Payer: Self-pay | Admitting: Nurse Practitioner

## 2021-03-16 ENCOUNTER — Ambulatory Visit (INDEPENDENT_AMBULATORY_CARE_PROVIDER_SITE_OTHER): Payer: PPO | Admitting: Vascular Surgery

## 2021-03-23 ENCOUNTER — Ambulatory Visit (INDEPENDENT_AMBULATORY_CARE_PROVIDER_SITE_OTHER): Payer: PPO | Admitting: Vascular Surgery

## 2021-04-08 DIAGNOSIS — Z03818 Encounter for observation for suspected exposure to other biological agents ruled out: Secondary | ICD-10-CM | POA: Diagnosis not present

## 2021-04-08 DIAGNOSIS — U071 COVID-19: Secondary | ICD-10-CM | POA: Diagnosis not present

## 2021-04-24 DIAGNOSIS — Z7902 Long term (current) use of antithrombotics/antiplatelets: Secondary | ICD-10-CM | POA: Diagnosis not present

## 2021-04-24 DIAGNOSIS — N1831 Chronic kidney disease, stage 3a: Secondary | ICD-10-CM | POA: Diagnosis not present

## 2021-04-24 DIAGNOSIS — Z1211 Encounter for screening for malignant neoplasm of colon: Secondary | ICD-10-CM | POA: Diagnosis not present

## 2021-07-11 ENCOUNTER — Ambulatory Visit
Admission: RE | Admit: 2021-07-11 | Discharge: 2021-07-11 | Disposition: A | Discharge status: Home / Self Care | Payer: PPO | Attending: Internal Medicine | Admitting: Internal Medicine

## 2021-07-11 ENCOUNTER — Encounter: Payer: Self-pay | Admitting: Internal Medicine

## 2021-07-11 ENCOUNTER — Ambulatory Visit: Payer: PPO | Admitting: Anesthesiology

## 2021-07-11 ENCOUNTER — Encounter
Admission: RE | Disposition: A | Discharge status: Home / Self Care | Payer: Self-pay | Source: Home / Self Care | Attending: Internal Medicine

## 2021-07-11 DIAGNOSIS — E785 Hyperlipidemia, unspecified: Secondary | ICD-10-CM | POA: Diagnosis not present

## 2021-07-11 DIAGNOSIS — D123 Benign neoplasm of transverse colon: Secondary | ICD-10-CM | POA: Diagnosis not present

## 2021-07-11 DIAGNOSIS — Z1211 Encounter for screening for malignant neoplasm of colon: Secondary | ICD-10-CM | POA: Insufficient documentation

## 2021-07-11 DIAGNOSIS — Z7902 Long term (current) use of antithrombotics/antiplatelets: Secondary | ICD-10-CM | POA: Insufficient documentation

## 2021-07-11 DIAGNOSIS — Z87891 Personal history of nicotine dependence: Secondary | ICD-10-CM | POA: Insufficient documentation

## 2021-07-11 DIAGNOSIS — Z79899 Other long term (current) drug therapy: Secondary | ICD-10-CM | POA: Diagnosis not present

## 2021-07-11 DIAGNOSIS — K648 Other hemorrhoids: Secondary | ICD-10-CM | POA: Diagnosis not present

## 2021-07-11 DIAGNOSIS — K635 Polyp of colon: Secondary | ICD-10-CM | POA: Diagnosis not present

## 2021-07-11 DIAGNOSIS — K640 First degree hemorrhoids: Secondary | ICD-10-CM | POA: Insufficient documentation

## 2021-07-11 DIAGNOSIS — K5730 Diverticulosis of large intestine without perforation or abscess without bleeding: Secondary | ICD-10-CM | POA: Diagnosis not present

## 2021-07-11 DIAGNOSIS — M1990 Unspecified osteoarthritis, unspecified site: Secondary | ICD-10-CM | POA: Insufficient documentation

## 2021-07-11 DIAGNOSIS — I10 Essential (primary) hypertension: Secondary | ICD-10-CM | POA: Insufficient documentation

## 2021-07-11 DIAGNOSIS — D124 Benign neoplasm of descending colon: Secondary | ICD-10-CM | POA: Insufficient documentation

## 2021-07-11 DIAGNOSIS — K219 Gastro-esophageal reflux disease without esophagitis: Secondary | ICD-10-CM | POA: Insufficient documentation

## 2021-07-11 HISTORY — PX: COLONOSCOPY WITH PROPOFOL: SHX5780

## 2021-07-11 SURGERY — COLONOSCOPY WITH PROPOFOL
Anesthesia: General

## 2021-07-11 MED ORDER — SODIUM CHLORIDE 0.9 % IV SOLN: INTRAVENOUS | Status: DC

## 2021-07-11 MED ORDER — LIDOCAINE HCL (CARDIAC) PF 100 MG/5ML IV SOSY
PREFILLED_SYRINGE | INTRAVENOUS | Status: DC | PRN
Start: 2021-07-11 — End: 2021-07-11
  Administered 2021-07-11: 100 mg via INTRAVENOUS

## 2021-07-11 MED ORDER — PROPOFOL 500 MG/50ML IV EMUL
INTRAVENOUS | Status: DC | PRN
Start: 2021-07-11 — End: 2021-07-11
  Administered 2021-07-11: 70 mg via INTRAVENOUS

## 2021-07-11 MED ORDER — PROPOFOL 500 MG/50ML IV EMUL
INTRAVENOUS | Status: DC | PRN
Start: 2021-07-11 — End: 2021-07-11
  Administered 2021-07-11: 50 ug/kg/min via INTRAVENOUS

## 2021-07-11 NOTE — Anesthesia Preprocedure Evaluation (Signed)
Anesthesia Evaluation  ?Patient identified by MRN, date of birth, ID band ?Patient awake ? ? ? ?Reviewed: ?Allergy & Precautions, NPO status , Patient's Chart, lab work & pertinent test results ? ?History of Anesthesia Complications ?Negative for: history of anesthetic complications ? ?Airway ?Mallampati: III ? ?TM Distance: <3 FB ?Neck ROM: full ? ? ? Dental ? ?(+) Chipped, Poor Dentition, Missing ?  ?Pulmonary ?neg shortness of breath, former smoker,  ?  ?Pulmonary exam normal ? ? ? ? ? ? ? Cardiovascular ?hypertension, (-) anginaNormal cardiovascular exam ? ? ?  ?Neuro/Psych ?negative neurological ROS ? negative psych ROS  ? GI/Hepatic ?negative GI ROS, Neg liver ROS, neg GERD  ,  ?Endo/Other  ?negative endocrine ROS ? Renal/GU ?Renal disease  ?negative genitourinary ?  ?Musculoskeletal ? ?(+) Arthritis ,  ? Abdominal ?  ?Peds ? Hematology ?negative hematology ROS ?(+)   ?Anesthesia Other Findings ?Past Medical History: ?No date: AAA (abdominal aortic aneurysm) ([place]) ?No date: Degenerative disc disease, lumbar ?No date: Hyperlipidemia ?No date: Hypertension ? ?Past Surgical History: ?No date: DESCENDING AORTIC ANEURYSM REPAIR W/ STENT ?04/19/2019: LOWER EXTREMITY ANGIOGRAPHY; Right ?    Comment:  Procedure: LOWER EXTREMITY ANGIOGRAPHY;  Surgeon: Lucky Cowboy,  ?             Erskine Squibb, MD;  Location: [facility];  Service:  ?             Cardiovascular;  Laterality: Right; ? ? ? ? Reproductive/Obstetrics ?negative OB ROS ? ?  ? ? ? ? ? ? ? ? ? ? ? ? ? ?  ?  ? ? ? ? ? ? ? ? ?Anesthesia Physical ?Anesthesia Plan ? ?ASA: 3 ? ?Anesthesia Plan: General  ? ?Post-op Pain Management:   ? ?Induction: Intravenous ? ?PONV Risk Score and Plan: Propofol infusion and TIVA ? ?Airway Management Planned: Natural Airway and Nasal Cannula ? ?Additional Equipment:  ? ?Intra-op Plan:  ? ?Post-operative Plan:  ? ?Informed Consent: I have reviewed the patients History and Physical, chart, labs and  discussed the procedure including the risks, benefits and alternatives for the proposed anesthesia with the patient or authorized representative who has indicated his/her understanding and acceptance.  ? ? ? ?Dental Advisory Given ? ?Plan Discussed with: Anesthesiologist, CRNA and Surgeon ? ?Anesthesia Plan Comments: (Patient consented for risks of anesthesia including but not limited to:  ?- adverse reactions to medications ?- risk of airway placement if required ?- damage to eyes, teeth, lips or other oral mucosa ?- nerve damage due to positioning  ?- sore throat or hoarseness ?- Damage to heart, brain, nerves, lungs, other parts of body or loss of life ? ?Patient voiced understanding.)  ? ? ? ? ? ? ?Anesthesia Quick Evaluation ? ?

## 2021-07-11 NOTE — Op Note (Signed)
[facility] ?Gastroenterology ?Patient Name: Jeffrey Salas ?Procedure Date: 07/11/2021 1:06 PM ?MRN: [id number] ?Account #: [id number] ?Date of Birth: 1950-03-28 ?Admit Type: Outpatient ?Age: 71 ?Room: [facility] ENDO ROOM 2 ?Gender: Male ?Note Status: Finalized ?Instrument Name: Colonoscope [id number] ?Procedure:             Colonoscopy ?Indications:           Screening for colorectal malignant neoplasm ?Providers:             Benay Pike. Alice Reichert MD, MD ?Referring MD:          Rusty Aus, MD (Referring MD) ?Medicines:             Propofol per Anesthesia ?Complications:         No immediate complications. Estimated blood loss: None. ?Procedure:             Pre-Anesthesia Assessment: ?                       - The risks and benefits of the procedure and the  ?                       sedation options and risks were discussed with the  ?                       patient. All questions were answered and informed  ?                       consent was obtained. ?                       - Patient identification and proposed procedure were  ?                       verified prior to the procedure by the nurse. The  ?                       procedure was verified in the procedure room. ?                       - ASA Grade Assessment: III - A patient with severe  ?                       systemic disease. ?                       - After reviewing the risks and benefits, the patient  ?                       was deemed in satisfactory condition to undergo the  ?                       procedure. ?                       After obtaining informed consent, the colonoscope was  ?                       passed under direct vision. Throughout the procedure,  ?  the patient's blood pressure, pulse, and oxygen  ?                       saturations were monitored continuously. The  ?                       Colonoscope was introduced through the anus and  ?                       advanced to the the cecum, identified by  appendiceal  ?                       orifice and ileocecal valve. The colonoscopy was  ?                       performed without difficulty. The patient tolerated  ?                       the procedure well. The quality of the bowel  ?                       preparation was good. The ileocecal valve, appendiceal  ?                       orifice, and rectum were photographed. ?Findings: ?     The perianal and digital rectal examinations were normal. Pertinent  ?     negatives include normal sphincter tone and no palpable rectal lesions. ?     Non-bleeding internal hemorrhoids were found during retroflexion. The  ?     hemorrhoids were Grade I (internal hemorrhoids that do not prolapse). ?     A 10 mm polyp was found in the transverse colon. The polyp was sessile.  ?     The polyp was removed with a hot snare. Resection and retrieval were  ?     complete. To prevent bleeding after the polypectomy, one hemostatic clip  ?     was successfully placed (MR conditional). There was no bleeding during,  ?     or at the end, of the procedure. ?     Two sessile polyps were found in the descending colon. The polyps were 6  ?     to 7 mm in size. These polyps were removed with a hot snare. Resection  ?     and retrieval were complete. ?     A 4 mm polyp was found in the descending colon. The polyp was sessile.  ?     The polyp was removed with a jumbo cold forceps. Resection and retrieval  ?     were complete. ?     A 5 mm polyp was found in the sigmoid colon. The polyp was sessile. The  ?     polyp was removed with a jumbo cold forceps. Resection and retrieval  ?     were complete. ?     A single medium-mouthed diverticulum was found in the ascending colon. ?     The exam was otherwise without abnormality on direct and retroflexion  ?     views. ?Impression:            - Non-bleeding internal hemorrhoids. ?                       -  One 10 mm polyp in the transverse colon, removed  ?                       with a hot snare. Resected  and retrieved. Clip (MR  ?                       conditional) was placed. ?                       - Two 6 to 7 mm polyps in the descending colon,  ?                       removed with a hot snare. Resected and retrieved. ?                       - One 4 mm polyp in the descending colon, removed with  ?                       a jumbo cold forceps. Resected and retrieved. ?                       - One 5 mm polyp in the sigmoid colon, removed with a  ?                       jumbo cold forceps. Resected and retrieved. ?                       - Diverticulosis in the ascending colon. ?                       - The examination was otherwise normal on direct and  ?                       retroflexion views. ?Recommendation:        - Patient has a contact number available for  ?                       emergencies. The signs and symptoms of potential  ?                       delayed complications were discussed with the patient.  ?                       Return to normal activities tomorrow. Written  ?                       discharge instructions were provided to the patient. ?                       - Resume previous diet. ?                       - Continue present medications. ?                       - Repeat colonoscopy is recommended for surveillance.  ?  The colonoscopy date will be determined after  ?                       pathology results from today's exam become available  ?                       for review. ?                       - Return to GI office PRN. ?                       - The findings and recommendations were discussed with  ?                       the patient. ?Procedure Code(s):     --- Professional --- ?                       [id number], Colonoscopy, flexible; with removal of  ?                       tumor(s), polyp(s), or other lesion(s) by snare  ?                       technique ?                       45380, 59, Colonoscopy, flexible; with biopsy, single  ?                       or  multiple ?Diagnosis Code(s):     --- Professional --- ?                       K57.30, Diverticulosis of large intestine without  ?                       perforation or abscess without bleeding ?                       K64.0, First degree hemorrhoids ?                       Z12.11, Encounter for screening for malignant neoplasm  ?                       of colon ?                       K63.5, Polyp of colon ?CPT copyright 2019 American Medical Association. All rights reserved. ?The codes documented in this report are preliminary and upon coder review may  ?be revised to meet current compliance requirements. ?Efrain Sella MD, MD ?07/11/2021 1:52:51 PM ?This report has been signed electronically. ?Number of Addenda: 0 ?Note Initiated On: 07/11/2021 1:06 PM ?Scope Withdrawal Time: 0 hours 17 minutes 46 seconds  ?Total Procedure Duration: 0 hours 21 minutes 54 seconds  ?Estimated Blood Loss:  Estimated blood loss: none. Estimated blood loss: none. ?     [facility] ?

## 2021-07-11 NOTE — H&P (Signed)
Outpatient short stay form Pre-procedure ?07/11/2021 12:08 PM ?Lorie Apley K. Alice Reichert, M.D. ? ?Primary Physician: Emily Filbert, M.D. ? ?Reason for visit:  Colon cancer screening ? ?History of present illness:  Mr. Jeffrey Salas presents to the [facility] at the request of his PCP for chief complaint of colon cancer screening. He was seen by me in the clinic as initial consult 07/2018 and scheduled for screening colonoscopy, but this was cancelled due to personal reasons. He presents today in hopes of rescheduling his procedure. Last colonoscopy ~2007 in [place] and was reportedly negative. No known family history of colorectal adenocarcinoma, advanced adenomas, or IBD. He denies any acute GI complaints today. He denies any recent changes in his bowel habits. He is typically having a formed BM daily without any fecal urgency, fecal incontinence, hematochezia, or melena. He denies any abdominal pain or abdominal cramping. Appetite and diet are stable without any unintentional weight loss. He denies any UGI symptoms such as dysphagia, odynophagia, early satiety, hoarseness, or epigastric abdominal pain.  ?Patient takes Plavix for personal history of popliteal artery aneurysm. ? ? ?No current facility-administered medications for this encounter. ? ?Medications Prior to Admission  ?Medication Sig Dispense Refill Last Dose  ? aspirin 81 MG chewable tablet Chew 81 mg by mouth daily.     ? atorvastatin (LIPITOR) 20 MG tablet Take 20 mg by mouth daily.     ? atorvastatin (LIPITOR) 20 MG tablet Take 20 mg by mouth daily.     ? carvedilol (COREG) 25 MG tablet Take 25 mg by mouth 2 (two) times daily with a meal.     ? clopidogrel (PLAVIX) 75 MG tablet TAKE ONE TABLET BY MOUTH DAILY 90 tablet 4   ? furosemide (LASIX) 20 MG tablet Take by mouth.     ? hydrALAZINE (APRESOLINE) 10 MG tablet Take 20 mg by mouth 3 (three) times daily.     ? olmesartan (BENICAR) 20 MG tablet Take 20 mg by mouth daily.     ? triamcinolone cream (KENALOG) 0.1  % Apply topically 2 (two) times daily.     ? vitamin B-12 (CYANOCOBALAMIN) 1000 MCG tablet Take 1,000 mcg by mouth daily.     ? ? ? ?No Known Allergies ? ? ?Past Medical History:  ?Diagnosis Date  ? AAA (abdominal aortic aneurysm)   ? Degenerative disc disease, lumbar   ? Hyperlipidemia   ? Hypertension   ? ? ?Review of systems:  Otherwise negative.  ? ? ?Physical Exam ? ?Gen: Alert, oriented. Appears stated age.  ?HEENT: /AT. PERRLA. ?Lungs: CTA, no wheezes. ?CV: RR nl S1, S2. ?Abd: soft, benign, no masses. BS+ ?Ext: No edema. Pulses 2+ ? ? ? ?Planned procedures: Proceed with colonoscopy. The patient understands the nature of the planned procedure, indications, risks, alternatives and potential complications including but not limited to bleeding, infection, perforation, damage to internal organs and possible oversedation/side effects from anesthesia. The patient agrees and gives consent to proceed.  ?Please refer to procedure notes for findings, recommendations and patient disposition/instructions.  ? ? ? ?Anayely Constantine K. Alice Reichert, M.D. ?Gastroenterology ?07/11/2021  12:08 PM ? ? ? ? ? ? ?

## 2021-07-11 NOTE — Transfer of Care (Signed)
Immediate Anesthesia Transfer of Care Note ? ?Patient: Jeffrey Salas ? ?Procedure(s) Performed: COLONOSCOPY WITH PROPOFOL ? ?Patient Location: PACU and Endoscopy Unit ? ?Anesthesia Type:MAC ? ?Level of Consciousness: drowsy ? ?Airway & Oxygen Therapy: Patient Spontanous Breathing and Patient connected to nasal cannula oxygen ? ?Post-op Assessment: Report given to RN and Post -op Vital signs reviewed and stable ? ?Post vital signs: Reviewed and stable ? ?Last Vitals:  ?Vitals Value Taken Time  ?BP    ?Temp    ?Pulse    ?Resp    ?SpO2    ? ? ?Last Pain:  ?Vitals:  ? 07/11/21 1231  ?TempSrc: Tympanic  ?PainSc: 0-No pain  ?   ? ?  ? ?Complications: No notable events documented. ?

## 2021-07-11 NOTE — Interval H&P Note (Signed)
History and Physical Interval Note: ? ?07/11/2021 ?12:09 PM ? ?Jeffrey Salas  has presented today for surgery, with the diagnosis of Z12.11  - Colon cancer screening.  The various methods of treatment have been discussed with the patient and family. After consideration of risks, benefits and other options for treatment, the patient has consented to  Procedure(s): ?COLONOSCOPY WITH PROPOFOL (N/A) as a surgical intervention.  The patient's history has been reviewed, patient examined, no change in status, stable for surgery.  I have reviewed the patient's chart and labs.  Questions were answered to the patient's satisfaction.   ? ? ?[place], [place] ? ? ?

## 2021-07-12 LAB — SURGICAL PATHOLOGY

## 2021-07-12 NOTE — Anesthesia Postprocedure Evaluation (Signed)
Anesthesia Post Note ? ?Patient: Danon Lograsso ? ?Procedure(s) Performed: COLONOSCOPY WITH PROPOFOL ? ?Patient location during evaluation: Endoscopy ?Anesthesia Type: General ?Level of consciousness: awake and alert ?Pain management: pain level controlled ?Vital Signs Assessment: post-procedure vital signs reviewed and stable ?Respiratory status: spontaneous breathing, nonlabored ventilation, respiratory function stable and patient connected to nasal cannula oxygen ?Cardiovascular status: blood pressure returned to baseline and stable ?Postop Assessment: no apparent nausea or vomiting ?Anesthetic complications: no ? ? ?No notable events documented. ? ? ?Last Vitals:  ?Vitals:  ? 07/11/21 1400 07/11/21 1409  ?BP: 124/77 125/74  ?Pulse: 60 (!) 57  ?Resp: 20 (!) 23  ?Temp:    ?SpO2: 100% 97%  ?  ?Last Pain:  ?Vitals:  ? 07/12/21 0724  ?TempSrc:   ?PainSc: 0-No pain  ? ? ?  ?  ?  ?  ?  ?  ? ?Martha Clan ? ? ? ? ?

## 2021-07-13 ENCOUNTER — Encounter: Payer: Self-pay | Admitting: Internal Medicine

## 2021-08-08 ENCOUNTER — Ambulatory Visit (INDEPENDENT_AMBULATORY_CARE_PROVIDER_SITE_OTHER): Payer: PPO | Admitting: Nurse Practitioner

## 2021-08-08 ENCOUNTER — Encounter (INDEPENDENT_AMBULATORY_CARE_PROVIDER_SITE_OTHER): Payer: Self-pay | Admitting: Nurse Practitioner

## 2021-08-08 VITALS — BP 111/72 | HR 52 | Resp 16

## 2021-08-08 DIAGNOSIS — L97211 Non-pressure chronic ulcer of right calf limited to breakdown of skin: Secondary | ICD-10-CM

## 2021-08-08 NOTE — Progress Notes (Signed)
History of Present Illness  There is no documented history at this time  Assessments & Plan   There are no diagnoses linked to this encounter.    Additional instructions  Subjective:  Patient presents with venous ulcer of the Right lower extremity.    Procedure:  3 layer unna wrap was placed Right lower extremity.   Plan:   Follow up in one week.   

## 2021-08-13 ENCOUNTER — Encounter (INDEPENDENT_AMBULATORY_CARE_PROVIDER_SITE_OTHER): Payer: Self-pay | Admitting: Nurse Practitioner

## 2021-08-14 ENCOUNTER — Ambulatory Visit (INDEPENDENT_AMBULATORY_CARE_PROVIDER_SITE_OTHER): Payer: PPO

## 2021-08-14 ENCOUNTER — Ambulatory Visit (INDEPENDENT_AMBULATORY_CARE_PROVIDER_SITE_OTHER): Payer: PPO | Admitting: Vascular Surgery

## 2021-08-14 ENCOUNTER — Encounter (INDEPENDENT_AMBULATORY_CARE_PROVIDER_SITE_OTHER): Payer: Self-pay | Admitting: Vascular Surgery

## 2021-08-14 VITALS — BP 105/69 | HR 57 | Resp 16 | Ht 73.0 in | Wt 237.0 lb

## 2021-08-14 DIAGNOSIS — I7143 Infrarenal abdominal aortic aneurysm, without rupture: Secondary | ICD-10-CM | POA: Diagnosis not present

## 2021-08-14 DIAGNOSIS — N183 Chronic kidney disease, stage 3 unspecified: Secondary | ICD-10-CM

## 2021-08-14 DIAGNOSIS — L97211 Non-pressure chronic ulcer of right calf limited to breakdown of skin: Secondary | ICD-10-CM | POA: Diagnosis not present

## 2021-08-14 DIAGNOSIS — E785 Hyperlipidemia, unspecified: Secondary | ICD-10-CM

## 2021-08-14 DIAGNOSIS — I714 Abdominal aortic aneurysm, without rupture, unspecified: Secondary | ICD-10-CM

## 2021-08-14 DIAGNOSIS — I10 Essential (primary) hypertension: Secondary | ICD-10-CM | POA: Diagnosis not present

## 2021-08-14 DIAGNOSIS — I724 Aneurysm of artery of lower extremity: Secondary | ICD-10-CM | POA: Diagnosis not present

## 2021-08-14 NOTE — Progress Notes (Signed)
MRN : [id number]  Jeffrey Salas is a 71 y.o. (1950-08-11) male who presents with chief complaint of No chief complaint on file. Marland Kitchen  History of Present Illness: Patient returns today in follow up of multiple vascular issues.  He still having swelling in his legs and has a recurrent ulceration on the right posterior lateral ankle and lower leg area.  This is about the size of a nickel or quarter at this point.  It is dry and superficial.  It is not all that painful.  He has been in [employer] for part of the spring and understands he probably needs them again. He is also followed for his abdominal aortic aneurysm and bilateral popliteal artery aneurysms.  He is status post stent graft repair of his abdominal aortic aneurysm and his right popliteal aneurysm over 2 years ago.  Duplex is done today which shows shrinkage of the aortic sac size down to 4.25 cm in maximal diameter without evidence of endoleak in the abdominal aorta. Duplex today shows a patent stent graft in the right popliteal artery with a maximal diameter of about 1.1 cm.  His left popliteal artery aneurysm is stable in size at 1.2 cm without significant growth.  Current Outpatient Medications  Medication Sig Dispense Refill   atorvastatin (LIPITOR) 20 MG tablet Take 20 mg by mouth daily.     atorvastatin (LIPITOR) 20 MG tablet Take 20 mg by mouth daily.     carvedilol (COREG) 25 MG tablet Take 25 mg by mouth 2 (two) times daily with a meal.     clopidogrel (PLAVIX) 75 MG tablet TAKE ONE TABLET BY MOUTH DAILY 90 tablet 4   furosemide (LASIX) 20 MG tablet Take by mouth.     hydrALAZINE (APRESOLINE) 10 MG tablet Take 20 mg by mouth 3 (three) times daily.     olmesartan (BENICAR) 20 MG tablet Take 20 mg by mouth daily.     vitamin B-12 (CYANOCOBALAMIN) 1000 MCG tablet Take 1,000 mcg by mouth daily.     aspirin 81 MG chewable tablet Chew 81 mg by mouth daily. (Patient not taking: Reported on 08/14/2021)     triamcinolone cream (KENALOG)  0.1 % Apply topically 2 (two) times daily. (Patient not taking: Reported on 07/11/2021)     No current facility-administered medications for this visit.    Past Medical History:  Diagnosis Date   AAA (abdominal aortic aneurysm) (HCC)    Degenerative disc disease, lumbar    Hyperlipidemia    Hypertension     Past Surgical History:  Procedure Laterality Date   COLONOSCOPY WITH PROPOFOL N/A 07/11/2021   Procedure: COLONOSCOPY WITH PROPOFOL;  Surgeon: Toledo, Benay Pike, MD;  Location: [facility];  Service: Gastroenterology;  Laterality: N/A;   DESCENDING AORTIC ANEURYSM REPAIR W/ STENT     LOWER EXTREMITY ANGIOGRAPHY Right 04/19/2019   Procedure: LOWER EXTREMITY ANGIOGRAPHY;  Surgeon: Algernon Huxley, MD;  Location: [facility];  Service: Cardiovascular;  Laterality: Right;     Social History   Tobacco Use   Smoking status: Former    Types: Cigars    Quit date: 07/28/2015    Years since quitting: 6.0   Smokeless tobacco: Never  Vaping Use   Vaping Use: Never used  Substance Use Topics   Alcohol use: Yes    Alcohol/week: 3.0 standard drinks    Types: 3 Glasses of wine per week   Drug use: Not Currently      Family History  Problem Relation  Age of Onset   Stroke Mother    COPD Brother   No bleeding or clotting disorders  No Known Allergies  REVIEW OF SYSTEMS (Negative unless checked)   Constitutional: '[]'$ Weight loss  '[]'$ Fever  '[]'$ Chills Cardiac: '[]'$ Chest pain   '[]'$ Chest pressure   '[]'$ Palpitations   '[]'$ Shortness of breath when laying flat   '[]'$ Shortness of breath at rest   '[]'$ Shortness of breath with exertion. Vascular:  '[]'$ Pain in legs with walking   '[]'$ Pain in legs at rest   '[]'$ Pain in legs when laying flat   '[]'$ Claudication   '[]'$ Pain in feet when walking  '[]'$ Pain in feet at rest  '[]'$ Pain in feet when laying flat   '[]'$ History of DVT   '[]'$ Phlebitis   '[x]'$ Swelling in legs   '[]'$ Varicose veins   '[x]'$ Non-healing ulcers Pulmonary:   '[]'$ Uses home oxygen   '[]'$ Productive cough   '[]'$ Hemoptysis    '[]'$ Wheeze  '[]'$ COPD   '[]'$ Asthma Neurologic:  '[]'$ Dizziness  '[]'$ Blackouts   '[]'$ Seizures   '[]'$ History of stroke   '[]'$ History of TIA  '[]'$ Aphasia   '[]'$ Temporary blindness   '[]'$ Dysphagia   '[]'$ Weakness or numbness in arms   '[]'$ Weakness or numbness in legs Musculoskeletal:  '[x]'$ Arthritis   '[]'$ Joint swelling   '[x]'$ Joint pain   '[]'$ Low back pain Hematologic:  '[]'$ Easy bruising  '[]'$ Easy bleeding   '[]'$ Hypercoagulable state   '[]'$ Anemic   Gastrointestinal:  '[]'$ Blood in stool   '[]'$ Vomiting blood  '[]'$ Gastroesophageal reflux/heartburn   '[]'$ Abdominal pain Genitourinary:  '[]'$ Chronic kidney disease   '[]'$ Difficult urination  '[]'$ Frequent urination  '[]'$ Burning with urination   '[]'$ Hematuria Skin:  '[]'$ Rashes   '[x]'$ Ulcers   '[x]'$ Wounds Psychological:  '[]'$ History of anxiety   '[]'$  History of major depression.  Physical Examination  BP 105/69 (BP Location: Right Arm)   Pulse (!) 57   Resp 16   Ht '6\' 1"'$  (1.854 m)   Wt 237 lb (107.5 kg)   BMI 31.27 kg/m  Gen:  WD/WN, NAD Head: [place]/AT, No temporalis wasting. Ear/Nose/Throat: Hearing grossly intact, nares w/o erythema or drainage Eyes: Conjunctiva clear. Sclera non-icteric Neck: Supple.  Trachea midline Pulmonary:  Good air movement, no use of accessory muscles.  Cardiac: RRR, no JVD Vascular:  Vessel Right Left  Radial Palpable Palpable               Musculoskeletal: M/S 5/5 throughout.  No deformity or atrophy.  Small superficial ulceration on the right posterior lateral ankle and lower leg area.  1-2+ right lower extremity edema with 1+ left lower extremity edema. Neurologic: Sensation grossly intact in extremities.  Symmetrical.  Speech is fluent.  Psychiatric: Judgment intact, Mood & affect appropriate for pt's clinical situation. Dermatologic: No rashes or ulcers noted.  No cellulitis or open wounds.      Labs Recent Results (from the past 2160 hour(s))  Surgical pathology     Status: None   Collection Time: 07/11/21  1:31 PM  Result Value Ref Range   SURGICAL PATHOLOGY      SURGICAL  PATHOLOGY CASE: [id number] PATIENT: Mariane Duval Surgical Pathology Report     Specimen Submitted: A. Colon polyp, transverse; hot snare B. Colon polyp x3, des, hot snare (2); cbx(1) C. Colon polyp, sigmoid; cbx  Clinical History: Z12.11- Colon cancer screening. colon polyps, internal hemorrhoids.    DIAGNOSIS: A. COLON POLYP, TRANSVERSE; HOT SNARE: - MULTIPLE FRAGMENTS OF TUBULAR ADENOMA. - CAUTERIZED POLYP BASE CANNOT BE RELIABLY ASSESSED SECONDARY TO TANGENTIAL ORIENTATION AND SPECIMEN FRAGMENTATION. - NEGATIVE FOR HIGH-GRADE DYSPLASIA AND MALIGNANCY.  B. COLON POLYPS X3, DESCENDING;  HOT SNARES (X2) AND COLD BIOPSY: - MULTIPLE FRAGMENTS OF TUBULAR ADENOMAS. - LOW-GRADE DYSPLASIA PRESENT AT CAUTERY. - NEGATIVE FOR HIGH-GRADE DYSPLASIA AND MALIGNANCY.  C. COLON POLYP, SIGMOID; COLD BIOPSY: - POLYPOID FRAGMENT OF BENIGN COLONIC MUCOSA WITH SUPERFICIAL HYPERPLASTIC/REACTIVE CHANGES. - NEGATIVE FOR DYSPLASIA AND MALIGNANCY.  GROSS DESCRIPTI ON: A. Labeled: Transverse colon polyp hot snare Received: Formalin Collection time: 1:31 PM on 07/11/2021 Placed into formalin time: 1:31 PM on 07/11/2021 Tissue fragment(s): 2 Size: Range from 0.2-0.6 cm Description: Received is a fragment of tan-pink soft tissue with a fragment of intestinal debris.  The ratio of soft tissue to intestinal debris is 90: 10.  The soft tissue fragment has a resection margin which is inked blue.  This fragment is bisected Entirely submitted in 1 cassette.  B. Labeled: Descending colon polyp hot snare x2, cbx x1 Received: Formalin Collection time: 1:34 PM on 07/11/2021 Placed into formalin time: 1:34 PM on 07/11/2021 Tissue fragment(s): Multiple Size: Aggregate, 1.1 x 0.5 x 0.3 cm Description: Received are fragments of tan-pink soft tissue admixed with intestinal debris.  The ratio of soft tissue to intestinal debris is 90: 10. Entirely submitted in 1 cassette.  C. Labeled: Sigmoid colon  polyp cbx Received: Formalin Collection time:  1:42 PM on 07/11/2021 Placed into formalin time: 1:42 PM on 07/11/2021 Tissue fragment(s): 1 Size: 0.6 x 0.4 x 0.2 cm Description: Tan soft tissue fragment Entirely submitted in 1 cassette.  RB 07/11/2021  Final Diagnosis performed by Allena Napoleon, MD.   Electronically signed 07/12/2021 9:11:38AM The electronic signature indicates that the named Attending Pathologist has evaluated the specimen Technical component performed at [facility], [address], [place], [place] [address] Lab: [phone] Dir: Rush Farmer, MD, MMM  Professional component performed at [facility], [facility], [place], [place], [place] [address] Lab: [phone] Dir: Kathi Simpers, MD     Radiology No results found.  Assessment/Plan Essential hypertension blood pressure control important in reducing the progression of atherosclerotic disease. On appropriate oral medications.     Hyperlipidemia lipid control important in reducing the progression of atherosclerotic disease. Continue statin therapy  AAA (abdominal aortic aneurysm) without rupture (HCC) Duplex is done today which shows shrinkage of the aortic sac size down to 4.25 cm in maximal diameter without evidence of endoleak in the abdominal aorta.  Doing well status postrepair with appropriate sac shrinkage.  Continue to follow annually.  Popliteal artery aneurysm (HCC) Duplex today shows a patent stent graft in the right popliteal artery with a maximal diameter of about 1.1 cm.  His left popliteal artery aneurysm is stable in size at 1.2 cm without significant growth.  This has not been repaired yet.  He is doing well.  We will continue to follow these on an annual basis.  Continue current medical regimen.  Lower limb ulcer, calf, right, limited to breakdown of skin ([place]) Replace Unna boot today and plan to change weekly.  Reassess in 3 to 4 weeks.    Leotis Pain,  MD  08/14/2021 9:49 AM    This note was created with Dragon medical transcription system.  Any errors from dictation are purely unintentional

## 2021-08-14 NOTE — Assessment & Plan Note (Signed)
Replace Unna boot today and plan to change weekly.  Reassess in 3 to 4 weeks.

## 2021-08-14 NOTE — Assessment & Plan Note (Signed)
Duplex is done today which shows shrinkage of the aortic sac size down to 4.25 cm in maximal diameter without evidence of endoleak in the abdominal aorta.  Doing well status postrepair with appropriate sac shrinkage.  Continue to follow annually.

## 2021-08-14 NOTE — Assessment & Plan Note (Signed)
Duplex today shows a patent stent graft in the right popliteal artery with a maximal diameter of about 1.1 cm.  His left popliteal artery aneurysm is stable in size at 1.2 cm without significant growth.  This has not been repaired yet.  He is doing well.  We will continue to follow these on an annual basis.  Continue current medical regimen.

## 2021-08-22 ENCOUNTER — Encounter (INDEPENDENT_AMBULATORY_CARE_PROVIDER_SITE_OTHER): Payer: PPO

## 2021-08-22 ENCOUNTER — Encounter (INDEPENDENT_AMBULATORY_CARE_PROVIDER_SITE_OTHER): Payer: Self-pay

## 2021-08-23 DIAGNOSIS — Z125 Encounter for screening for malignant neoplasm of prostate: Secondary | ICD-10-CM | POA: Diagnosis not present

## 2021-08-23 DIAGNOSIS — E538 Deficiency of other specified B group vitamins: Secondary | ICD-10-CM | POA: Diagnosis not present

## 2021-08-23 DIAGNOSIS — E782 Mixed hyperlipidemia: Secondary | ICD-10-CM | POA: Diagnosis not present

## 2021-08-23 DIAGNOSIS — R739 Hyperglycemia, unspecified: Secondary | ICD-10-CM | POA: Diagnosis not present

## 2021-08-30 DIAGNOSIS — E782 Mixed hyperlipidemia: Secondary | ICD-10-CM | POA: Diagnosis not present

## 2021-08-30 DIAGNOSIS — R972 Elevated prostate specific antigen [PSA]: Secondary | ICD-10-CM | POA: Diagnosis not present

## 2021-08-30 DIAGNOSIS — N184 Chronic kidney disease, stage 4 (severe): Secondary | ICD-10-CM | POA: Diagnosis not present

## 2021-08-30 DIAGNOSIS — I724 Aneurysm of artery of lower extremity: Secondary | ICD-10-CM | POA: Diagnosis not present

## 2021-08-30 DIAGNOSIS — Z0000 Encounter for general adult medical examination without abnormal findings: Secondary | ICD-10-CM | POA: Diagnosis not present

## 2021-08-30 DIAGNOSIS — E538 Deficiency of other specified B group vitamins: Secondary | ICD-10-CM | POA: Diagnosis not present

## 2021-09-28 DIAGNOSIS — N184 Chronic kidney disease, stage 4 (severe): Secondary | ICD-10-CM | POA: Diagnosis not present

## 2022-01-21 ENCOUNTER — Encounter (INDEPENDENT_AMBULATORY_CARE_PROVIDER_SITE_OTHER): Payer: Self-pay

## 2022-02-12 DIAGNOSIS — H353132 Nonexudative age-related macular degeneration, bilateral, intermediate dry stage: Secondary | ICD-10-CM | POA: Diagnosis not present

## 2022-03-05 DIAGNOSIS — E782 Mixed hyperlipidemia: Secondary | ICD-10-CM | POA: Diagnosis not present

## 2022-03-05 DIAGNOSIS — R972 Elevated prostate specific antigen [PSA]: Secondary | ICD-10-CM | POA: Diagnosis not present

## 2022-03-05 DIAGNOSIS — E538 Deficiency of other specified B group vitamins: Secondary | ICD-10-CM | POA: Diagnosis not present

## 2022-03-12 DIAGNOSIS — I10 Essential (primary) hypertension: Secondary | ICD-10-CM | POA: Diagnosis not present

## 2022-03-12 DIAGNOSIS — I724 Aneurysm of artery of lower extremity: Secondary | ICD-10-CM | POA: Diagnosis not present

## 2022-03-12 DIAGNOSIS — N1832 Chronic kidney disease, stage 3b: Secondary | ICD-10-CM | POA: Diagnosis not present

## 2022-03-12 DIAGNOSIS — Z125 Encounter for screening for malignant neoplasm of prostate: Secondary | ICD-10-CM | POA: Diagnosis not present

## 2022-03-12 DIAGNOSIS — E538 Deficiency of other specified B group vitamins: Secondary | ICD-10-CM | POA: Diagnosis not present

## 2022-03-12 DIAGNOSIS — E782 Mixed hyperlipidemia: Secondary | ICD-10-CM | POA: Diagnosis not present

## 2022-03-19 DIAGNOSIS — J40 Bronchitis, not specified as acute or chronic: Secondary | ICD-10-CM | POA: Diagnosis not present

## 2022-03-19 DIAGNOSIS — B349 Viral infection, unspecified: Secondary | ICD-10-CM | POA: Diagnosis not present

## 2022-04-12 DIAGNOSIS — I10 Essential (primary) hypertension: Secondary | ICD-10-CM | POA: Diagnosis not present

## 2022-04-12 DIAGNOSIS — M5116 Intervertebral disc disorders with radiculopathy, lumbar region: Secondary | ICD-10-CM | POA: Diagnosis not present

## 2022-04-19 DIAGNOSIS — M5136 Other intervertebral disc degeneration, lumbar region: Secondary | ICD-10-CM | POA: Diagnosis not present

## 2022-04-19 DIAGNOSIS — M5116 Intervertebral disc disorders with radiculopathy, lumbar region: Secondary | ICD-10-CM | POA: Diagnosis not present

## 2022-04-19 DIAGNOSIS — R5382 Chronic fatigue, unspecified: Secondary | ICD-10-CM | POA: Diagnosis not present

## 2022-05-02 DIAGNOSIS — B0223 Postherpetic polyneuropathy: Secondary | ICD-10-CM | POA: Diagnosis not present

## 2022-08-13 ENCOUNTER — Ambulatory Visit (INDEPENDENT_AMBULATORY_CARE_PROVIDER_SITE_OTHER): Payer: PPO

## 2022-08-13 ENCOUNTER — Ambulatory Visit (INDEPENDENT_AMBULATORY_CARE_PROVIDER_SITE_OTHER): Payer: PPO | Admitting: Vascular Surgery

## 2022-08-13 VITALS — BP 121/77 | HR 56 | Resp 17 | Ht 73.0 in | Wt 226.4 lb

## 2022-08-13 DIAGNOSIS — I7143 Infrarenal abdominal aortic aneurysm, without rupture: Secondary | ICD-10-CM

## 2022-08-13 DIAGNOSIS — E785 Hyperlipidemia, unspecified: Secondary | ICD-10-CM | POA: Diagnosis not present

## 2022-08-13 DIAGNOSIS — I890 Lymphedema, not elsewhere classified: Secondary | ICD-10-CM

## 2022-08-13 DIAGNOSIS — I724 Aneurysm of artery of lower extremity: Secondary | ICD-10-CM | POA: Diagnosis not present

## 2022-08-13 DIAGNOSIS — I10 Essential (primary) hypertension: Secondary | ICD-10-CM

## 2022-08-13 NOTE — Progress Notes (Signed)
MRN : [id number]  Jeffrey Salas is a 72 y.o. (07-12-1950) male who presents with chief complaint of  Chief Complaint  Patient presents with   Follow-up  .  History of Present Illness: Patient returns today in follow up of multiple vascular issues.  He is status post abdominal aortic aneurysm repair as well as a right popliteal aneurysm repair in years past.  He has had chronic right leg swelling which has been problematic for him.  Last year he had to have the Unna boots for a small ulceration with the swelling.  He has been diligently wearing his compression socks and elevating his legs without significant improvement in swelling.  From his previous surgeries and swelling, as well as previous popliteal aneurysm which caused some marked swelling, he appears to have developed stage II lymphedema from chronic scarring and lymphatic channels.  He now has hyperpigmentation with skin changes.  He has been diligently exercising, elevating his legs, and wearing compression socks for many months now without significant improvement. We followed up his aneurysmal disease with duplex studies today.  His abdominal aortic aneurysm stent graft is widely patent with a maximal aortic sac diameter of approximately 4.5 cm which is stable. Popliteal duplex today reveals a patent stent graft on the right with a maximal diameter of 1.0 cm and a stable 1.15 cm left popliteal artery aneurysm which has not grown.  Current Outpatient Medications  Medication Sig Dispense Refill   amLODipine (NORVASC) 5 MG tablet Take 5 mg by mouth daily.     atorvastatin (LIPITOR) 20 MG tablet Take 20 mg by mouth daily.     carvedilol (COREG) 25 MG tablet Take 25 mg by mouth 2 (two) times daily with a meal.     clopidogrel (PLAVIX) 75 MG tablet TAKE ONE TABLET BY MOUTH DAILY 90 tablet 4   cyanocobalamin (VITAMIN B12) 500 MCG tablet Take 500 mcg by mouth daily.     furosemide (LASIX) 20 MG tablet Take by mouth.     No current  facility-administered medications for this visit.    Past Medical History:  Diagnosis Date   AAA (abdominal aortic aneurysm) (HCC)    Degenerative disc disease, lumbar    Hyperlipidemia    Hypertension     Past Surgical History:  Procedure Laterality Date   COLONOSCOPY WITH PROPOFOL N/A 07/11/2021   Procedure: COLONOSCOPY WITH PROPOFOL;  Surgeon: Toledo, Boykin Nearing, MD;  Location: [facility];  Service: Gastroenterology;  Laterality: N/A;   DESCENDING AORTIC ANEURYSM REPAIR W/ STENT     LOWER EXTREMITY ANGIOGRAPHY Right 04/19/2019   Procedure: LOWER EXTREMITY ANGIOGRAPHY;  Surgeon: Annice Needy, MD;  Location: [facility];  Service: Cardiovascular;  Laterality: Right;     Social History   Tobacco Use   Smoking status: Former    Types: Cigars    Quit date: 07/28/2015    Years since quitting: 7.0   Smokeless tobacco: Never  Vaping Use   Vaping Use: Never used  Substance Use Topics   Alcohol use: Yes    Alcohol/week: 3.0 standard drinks of alcohol    Types: 3 Glasses of wine per week   Drug use: Not Currently      Family History  Problem Relation Age of Onset   Stroke Mother    COPD Brother   No bleeding or clotting disorders   No Known Allergies  REVIEW OF SYSTEMS (Negative unless checked)   Constitutional: [] Weight loss  [] Fever  [] Chills Cardiac: [] Chest pain   []   Chest pressure   [] Palpitations   [] Shortness of breath when laying flat   [] Shortness of breath at rest   [] Shortness of breath with exertion. Vascular:  [] Pain in legs with walking   [] Pain in legs at rest   [] Pain in legs when laying flat   [] Claudication   [] Pain in feet when walking  [] Pain in feet at rest  [] Pain in feet when laying flat   [] History of DVT   [] Phlebitis   [x] Swelling in legs   [] Varicose veins   [x] Non-healing ulcers Pulmonary:   [] Uses home oxygen   [] Productive cough   [] Hemoptysis   [] Wheeze  [] COPD   [] Asthma Neurologic:  [] Dizziness  [] Blackouts   [] Seizures    [] History of stroke   [] History of TIA  [] Aphasia   [] Temporary blindness   [] Dysphagia   [] Weakness or numbness in arms   [] Weakness or numbness in legs Musculoskeletal:  [x] Arthritis   [] Joint swelling   [x] Joint pain   [] Low back pain Hematologic:  [] Easy bruising  [] Easy bleeding   [] Hypercoagulable state   [] Anemic   Gastrointestinal:  [] Blood in stool   [] Vomiting blood  [] Gastroesophageal reflux/heartburn   [] Abdominal pain Genitourinary:  [] Chronic kidney disease   [] Difficult urination  [] Frequent urination  [] Burning with urination   [] Hematuria Skin:  [] Rashes   [x] Ulcers   [x] Wounds Psychological:  [] History of anxiety   []  History of major depression.  Physical Examination  BP 121/77 (BP Location: Left Arm)   Pulse (!) 56   Resp 17   Ht 6\' 1"  (1.854 m)   Wt 226 lb 6.4 oz (102.7 kg)   BMI 29.87 kg/m  Gen:  WD/WN, NAD Head: [place]/AT, No temporalis wasting. Ear/Nose/Throat: Hearing grossly intact, nares w/o erythema or drainage Eyes: Conjunctiva clear. Sclera non-icteric Neck: Supple.  Trachea midline Pulmonary:  Good air movement, no use of accessory muscles.  Cardiac: RRR, no JVD Vascular:  Vessel Right Left  Radial Palpable Palpable                          PT Palpable Palpable  DP Palpable Palpable   Gastrointestinal: soft, non-tender/non-distended. No guarding/reflex.  Musculoskeletal: M/S 5/5 throughout.  No deformity or atrophy.  Hyperpigmentation is present on the right.  1+ right lower extremity edema. Neurologic: Sensation grossly intact in extremities.  Symmetrical.  Speech is fluent.  Psychiatric: Judgment intact, Mood & affect appropriate for pt's clinical situation. Dermatologic: No rashes or ulcers noted.  No cellulitis or open wounds.      Labs No results found for this or any previous visit (from the past 2160 hour(s)).  Radiology No results found.  Assessment/Plan Essential hypertension blood pressure control important in reducing the  progression of atherosclerotic disease. On appropriate oral medications.     Hyperlipidemia lipid control important in reducing the progression of atherosclerotic disease. Continue statin therapy  AAA (abdominal aortic aneurysm) without rupture (HCC) His abdominal aortic aneurysm stent graft is widely patent with a maximal aortic sac diameter of approximately 4.5 cm which is stable. Doing well many years s/p repair.  Continue to follow annually.  Popliteal artery aneurysm (HCC) Popliteal duplex today reveals a patent stent graft on the right with a maximal diameter of 1.0 cm and a stable 1.15 cm left popliteal artery aneurysm which has not grown.  No role for intervention at this time.  Does still have significant swelling in the right leg getting him a lymphedema pump.  Follow-up  in 1 year with duplex.  Lymphedema Patient has stage II lymphedema which is developed from chronic swelling and scarring of the lymphatic channels.  The patient has appropriately use compression socks, elevate his legs, and exercised for roughly a year now.  He would clearly benefit from a lymphedema pump as an adjuvant therapy to improve his leg swelling.  We will try to get that approved for him future.    Festus Barren, MD  08/13/2022 10:04 AM    This note was created with Dragon medical transcription system.  Any errors from dictation are purely unintentional

## 2022-08-13 NOTE — Assessment & Plan Note (Signed)
Patient has stage II lymphedema which is developed from chronic swelling and scarring of the lymphatic channels.  The patient has appropriately use compression socks, elevate his legs, and exercised for roughly a year now.  He would clearly benefit from a lymphedema pump as an adjuvant therapy to improve his leg swelling.  We will try to get that approved for him future.

## 2022-08-13 NOTE — Assessment & Plan Note (Signed)
Popliteal duplex today reveals a patent stent graft on the right with a maximal diameter of 1.0 cm and a stable 1.15 cm left popliteal artery aneurysm which has not grown.  No role for intervention at this time.  Does still have significant swelling in the right leg getting him a lymphedema pump.  Follow-up in 1 year with duplex.

## 2022-08-13 NOTE — Assessment & Plan Note (Signed)
His abdominal aortic aneurysm stent graft is widely patent with a maximal aortic sac diameter of approximately 4.5 cm which is stable. Doing well many years s/p repair.  Continue to follow annually.

## 2022-09-03 DIAGNOSIS — E782 Mixed hyperlipidemia: Secondary | ICD-10-CM | POA: Diagnosis not present

## 2022-09-03 DIAGNOSIS — E538 Deficiency of other specified B group vitamins: Secondary | ICD-10-CM | POA: Diagnosis not present

## 2022-09-03 DIAGNOSIS — Z125 Encounter for screening for malignant neoplasm of prostate: Secondary | ICD-10-CM | POA: Diagnosis not present

## 2022-09-10 ENCOUNTER — Other Ambulatory Visit: Payer: Self-pay

## 2022-09-10 DIAGNOSIS — E782 Mixed hyperlipidemia: Secondary | ICD-10-CM | POA: Diagnosis not present

## 2022-09-10 DIAGNOSIS — R972 Elevated prostate specific antigen [PSA]: Secondary | ICD-10-CM

## 2022-09-10 DIAGNOSIS — Z0000 Encounter for general adult medical examination without abnormal findings: Secondary | ICD-10-CM | POA: Diagnosis not present

## 2022-09-10 DIAGNOSIS — I724 Aneurysm of artery of lower extremity: Secondary | ICD-10-CM | POA: Diagnosis not present

## 2022-09-10 DIAGNOSIS — E538 Deficiency of other specified B group vitamins: Secondary | ICD-10-CM | POA: Diagnosis not present

## 2022-09-10 DIAGNOSIS — N1832 Chronic kidney disease, stage 3b: Secondary | ICD-10-CM | POA: Diagnosis not present

## 2022-09-19 ENCOUNTER — Ambulatory Visit
Admission: RE | Admit: 2022-09-19 | Discharge: 2022-09-19 | Disposition: A | Discharge status: Home / Self Care | Payer: PPO | Source: Ambulatory Visit | Attending: Internal Medicine | Admitting: Internal Medicine

## 2022-09-19 DIAGNOSIS — N400 Enlarged prostate without lower urinary tract symptoms: Secondary | ICD-10-CM | POA: Diagnosis not present

## 2022-09-19 DIAGNOSIS — R972 Elevated prostate specific antigen [PSA]: Secondary | ICD-10-CM

## 2022-09-19 DIAGNOSIS — I723 Aneurysm of iliac artery: Secondary | ICD-10-CM | POA: Diagnosis not present

## 2022-09-19 DIAGNOSIS — N4289 Other specified disorders of prostate: Secondary | ICD-10-CM | POA: Diagnosis not present

## 2022-09-19 MED ORDER — GADOBUTROL 1 MMOL/ML IV SOLN
10.0000 mL | Freq: Once | INTRAVENOUS | Status: AC | PRN
  Administered 2022-09-19: 10 mL via INTRAVENOUS

## 2022-10-31 DIAGNOSIS — R972 Elevated prostate specific antigen [PSA]: Secondary | ICD-10-CM | POA: Diagnosis not present

## 2022-12-02 DIAGNOSIS — R972 Elevated prostate specific antigen [PSA]: Secondary | ICD-10-CM | POA: Diagnosis not present

## 2022-12-02 DIAGNOSIS — C61 Malignant neoplasm of prostate: Secondary | ICD-10-CM | POA: Diagnosis not present

## 2022-12-02 DIAGNOSIS — N4289 Other specified disorders of prostate: Secondary | ICD-10-CM | POA: Diagnosis not present

## 2022-12-09 DIAGNOSIS — C61 Malignant neoplasm of prostate: Secondary | ICD-10-CM | POA: Diagnosis not present

## 2022-12-30 DIAGNOSIS — C61 Malignant neoplasm of prostate: Secondary | ICD-10-CM | POA: Diagnosis not present

## 2023-02-17 DIAGNOSIS — H35371 Puckering of macula, right eye: Secondary | ICD-10-CM | POA: Diagnosis not present

## 2023-02-17 DIAGNOSIS — H353132 Nonexudative age-related macular degeneration, bilateral, intermediate dry stage: Secondary | ICD-10-CM | POA: Diagnosis not present

## 2023-02-17 DIAGNOSIS — Z961 Presence of intraocular lens: Secondary | ICD-10-CM | POA: Diagnosis not present

## 2023-02-26 DIAGNOSIS — E538 Deficiency of other specified B group vitamins: Secondary | ICD-10-CM | POA: Diagnosis not present

## 2023-02-26 DIAGNOSIS — R972 Elevated prostate specific antigen [PSA]: Secondary | ICD-10-CM | POA: Diagnosis not present

## 2023-02-26 DIAGNOSIS — E782 Mixed hyperlipidemia: Secondary | ICD-10-CM | POA: Diagnosis not present

## 2023-03-11 DIAGNOSIS — R739 Hyperglycemia, unspecified: Secondary | ICD-10-CM | POA: Diagnosis not present

## 2023-03-11 DIAGNOSIS — Z125 Encounter for screening for malignant neoplasm of prostate: Secondary | ICD-10-CM | POA: Diagnosis not present

## 2023-03-11 DIAGNOSIS — R972 Elevated prostate specific antigen [PSA]: Secondary | ICD-10-CM | POA: Diagnosis not present

## 2023-03-11 DIAGNOSIS — E538 Deficiency of other specified B group vitamins: Secondary | ICD-10-CM | POA: Diagnosis not present

## 2023-03-11 DIAGNOSIS — E782 Mixed hyperlipidemia: Secondary | ICD-10-CM | POA: Diagnosis not present

## 2023-05-27 DIAGNOSIS — I7777 Dissection of artery of lower extremity: Secondary | ICD-10-CM | POA: Diagnosis not present

## 2023-05-27 DIAGNOSIS — I7143 Infrarenal abdominal aortic aneurysm, without rupture: Secondary | ICD-10-CM | POA: Diagnosis not present

## 2023-05-27 DIAGNOSIS — I10 Essential (primary) hypertension: Secondary | ICD-10-CM | POA: Diagnosis not present

## 2023-05-27 DIAGNOSIS — I7121 Aneurysm of the ascending aorta, without rupture: Secondary | ICD-10-CM | POA: Diagnosis not present

## 2023-05-27 DIAGNOSIS — I714 Abdominal aortic aneurysm, without rupture, unspecified: Secondary | ICD-10-CM | POA: Diagnosis not present

## 2023-05-27 DIAGNOSIS — I723 Aneurysm of iliac artery: Secondary | ICD-10-CM | POA: Diagnosis not present

## 2023-05-28 DIAGNOSIS — Z95828 Presence of other vascular implants and grafts: Secondary | ICD-10-CM | POA: Diagnosis not present

## 2023-08-12 ENCOUNTER — Ambulatory Visit (INDEPENDENT_AMBULATORY_CARE_PROVIDER_SITE_OTHER): Payer: PPO | Admitting: Vascular Surgery

## 2023-08-12 ENCOUNTER — Other Ambulatory Visit (INDEPENDENT_AMBULATORY_CARE_PROVIDER_SITE_OTHER): Payer: PPO

## 2023-08-12 ENCOUNTER — Encounter (INDEPENDENT_AMBULATORY_CARE_PROVIDER_SITE_OTHER): Payer: Self-pay

## 2023-08-12 ENCOUNTER — Encounter (INDEPENDENT_AMBULATORY_CARE_PROVIDER_SITE_OTHER): Payer: PPO

## 2023-08-15 DIAGNOSIS — R739 Hyperglycemia, unspecified: Secondary | ICD-10-CM | POA: Diagnosis not present

## 2023-08-15 DIAGNOSIS — Z125 Encounter for screening for malignant neoplasm of prostate: Secondary | ICD-10-CM | POA: Diagnosis not present

## 2023-08-15 DIAGNOSIS — E782 Mixed hyperlipidemia: Secondary | ICD-10-CM | POA: Diagnosis not present

## 2023-08-15 DIAGNOSIS — E538 Deficiency of other specified B group vitamins: Secondary | ICD-10-CM | POA: Diagnosis not present

## 2023-08-15 DIAGNOSIS — R972 Elevated prostate specific antigen [PSA]: Secondary | ICD-10-CM | POA: Diagnosis not present

## 2023-08-20 ENCOUNTER — Other Ambulatory Visit (INDEPENDENT_AMBULATORY_CARE_PROVIDER_SITE_OTHER): Payer: Self-pay | Admitting: Vascular Surgery

## 2023-08-20 DIAGNOSIS — I7143 Infrarenal abdominal aortic aneurysm, without rupture: Secondary | ICD-10-CM

## 2023-08-20 DIAGNOSIS — I724 Aneurysm of artery of lower extremity: Secondary | ICD-10-CM

## 2023-08-21 DIAGNOSIS — C61 Malignant neoplasm of prostate: Secondary | ICD-10-CM | POA: Diagnosis not present

## 2023-08-21 DIAGNOSIS — E782 Mixed hyperlipidemia: Secondary | ICD-10-CM | POA: Diagnosis not present

## 2023-08-21 DIAGNOSIS — Z0000 Encounter for general adult medical examination without abnormal findings: Secondary | ICD-10-CM | POA: Diagnosis not present

## 2023-08-21 DIAGNOSIS — N1832 Chronic kidney disease, stage 3b: Secondary | ICD-10-CM | POA: Diagnosis not present

## 2023-08-21 DIAGNOSIS — E538 Deficiency of other specified B group vitamins: Secondary | ICD-10-CM | POA: Diagnosis not present

## 2023-08-21 DIAGNOSIS — Z961 Presence of intraocular lens: Secondary | ICD-10-CM | POA: Diagnosis not present

## 2023-08-21 DIAGNOSIS — Z125 Encounter for screening for malignant neoplasm of prostate: Secondary | ICD-10-CM | POA: Diagnosis not present

## 2023-08-21 DIAGNOSIS — H353132 Nonexudative age-related macular degeneration, bilateral, intermediate dry stage: Secondary | ICD-10-CM | POA: Diagnosis not present

## 2023-08-21 DIAGNOSIS — R739 Hyperglycemia, unspecified: Secondary | ICD-10-CM | POA: Diagnosis not present

## 2023-08-22 ENCOUNTER — Ambulatory Visit (INDEPENDENT_AMBULATORY_CARE_PROVIDER_SITE_OTHER): Payer: PPO

## 2023-08-22 ENCOUNTER — Encounter (INDEPENDENT_AMBULATORY_CARE_PROVIDER_SITE_OTHER): Payer: Self-pay | Admitting: Vascular Surgery

## 2023-08-22 ENCOUNTER — Ambulatory Visit (INDEPENDENT_AMBULATORY_CARE_PROVIDER_SITE_OTHER): Payer: PPO | Admitting: Vascular Surgery

## 2023-08-22 VITALS — BP 131/89 | HR 65 | Resp 18 | Ht 73.0 in | Wt 189.2 lb

## 2023-08-22 DIAGNOSIS — I890 Lymphedema, not elsewhere classified: Secondary | ICD-10-CM

## 2023-08-22 DIAGNOSIS — I7143 Infrarenal abdominal aortic aneurysm, without rupture: Secondary | ICD-10-CM | POA: Diagnosis not present

## 2023-08-22 DIAGNOSIS — I724 Aneurysm of artery of lower extremity: Secondary | ICD-10-CM

## 2023-08-22 DIAGNOSIS — I10 Essential (primary) hypertension: Secondary | ICD-10-CM | POA: Diagnosis not present

## 2023-08-22 NOTE — Assessment & Plan Note (Signed)
 Leg swelling is essentially gone at this point.  He has lost roughly 50 pounds and that has significantly helped this.

## 2023-08-22 NOTE — Assessment & Plan Note (Signed)
 Many years status post endovascular repair.  There does appear to be an endoleak on the duplex scan today, but the overall aortic sac diameter has not significantly increased in size.  The iliac size has increased some so we will have to watch this going forward, and if continued growth is seen, repair likely with stent extension down into the external iliac artery and coil embolization of the internal iliac artery on 1 side or another may be necessary.

## 2023-08-22 NOTE — Progress Notes (Signed)
 MRN : [id number]  Jeffrey Salas is a 73 y.o. ([birth year]) male who presents with chief complaint of  Chief Complaint  Patient presents with   Follow-up    1 year EVAR + BLE art dup  .  History of Present Illness: Patient returns today in follow up of multiple vascular issues.  He is many years status post endovascular abdominal aortic aneurysm repair as well as a right popliteal artery aneurysm repair about 4 years ago.  He is doing well.  He denies any current aneurysm related symptoms such as back or abdominal pain or signs of peripheral embolization.  He had some leg pain on the left that was evaluated earlier this year and no change was seen in his popliteal aneurysm at that time.  He has moved to [facility], but desires to continue to get his medical care here. There does appear to be an endoleak on the duplex scan today, but the overall aortic sac diameter has not significantly increased in size.  The iliac size has increased some so we will have to watch this going forward, and if continued growth is seen, repair likely with stent extension down into the external iliac artery and coil embolization of the internal iliac artery on 1 side or another may be necessary. Lower extremity arterial duplex shows a patent stent in the right popliteal artery with stable enlargement around the stent of about 1.3 cm.  His left popliteal artery has increased from 1.15 cm to 1.2 cm but remains biphasic with good flow.  Current Outpatient Medications  Medication Sig Dispense Refill   amLODipine (NORVASC) 5 MG tablet Take 5 mg by mouth daily.     atorvastatin (LIPITOR) 20 MG tablet Take 20 mg by mouth daily.     carvedilol (COREG) 25 MG tablet Take 25 mg by mouth 2 (two) times daily with a meal.     clopidogrel  (PLAVIX ) 75 MG tablet TAKE ONE TABLET BY MOUTH DAILY 90 tablet 4   cyanocobalamin  (VITAMIN B12) 500 MCG tablet Take 500 mcg by mouth daily.     furosemide (LASIX) 20 MG tablet Take by mouth.      sildenafil (REVATIO) 20 MG tablet SMARTSIG:1-5 Tablet(s) By Mouth PRN     No current facility-administered medications for this visit.    Past Medical History:  Diagnosis Date   AAA (abdominal aortic aneurysm) (HCC)    Degenerative disc disease, lumbar    Hyperlipidemia    Hypertension     Past Surgical History:  Procedure Laterality Date   COLONOSCOPY WITH PROPOFOL  N/A 07/11/2021   Procedure: COLONOSCOPY WITH PROPOFOL ;  Surgeon: Toledo, Alphonsus Jeans, MD;  Location: [facility] ENDOSCOPY;  Service: Gastroenterology;  Laterality: N/A;   DESCENDING AORTIC ANEURYSM REPAIR W/ STENT     LOWER EXTREMITY ANGIOGRAPHY Right 04/19/2019   Procedure: LOWER EXTREMITY ANGIOGRAPHY;  Surgeon: Celso College, MD;  Location: [facility] INVASIVE CV LAB;  Service: Cardiovascular;  Laterality: Right;     Social History   Tobacco Use   Smoking status: Former    Types: Cigars    Quit date: 07/28/2015    Years since quitting: 8.0   Smokeless tobacco: Never  Vaping Use   Vaping status: Never Used  Substance Use Topics   Alcohol use: Yes    Alcohol/week: 3.0 standard drinks of alcohol    Types: 3 Glasses of wine per week   Drug use: Not Currently      Family History  Problem Relation Age of Onset  Stroke Mother    COPD Brother   No bleeding or clotting disorders  No Known Allergies  REVIEW OF SYSTEMS (Negative unless checked)   Constitutional: [x] Weight loss  [] Fever  [] Chills Cardiac: [] Chest pain   [] Chest pressure   [] Palpitations   [] Shortness of breath when laying flat   [] Shortness of breath at rest   [] Shortness of breath with exertion. Vascular:  [] Pain in legs with walking   [] Pain in legs at rest   [] Pain in legs when laying flat   [] Claudication   [] Pain in feet when walking  [] Pain in feet at rest  [] Pain in feet when laying flat   [] History of DVT   [] Phlebitis   [x] Swelling in legs   [] Varicose veins   [x] Non-healing ulcers Pulmonary:   [] Uses home oxygen   [] Productive cough   [] Hemoptysis    [] Wheeze  [] COPD   [] Asthma Neurologic:  [] Dizziness  [] Blackouts   [] Seizures   [] History of stroke   [] History of TIA  [] Aphasia   [] Temporary blindness   [] Dysphagia   [] Weakness or numbness in arms   [] Weakness or numbness in legs Musculoskeletal:  [x] Arthritis   [] Joint swelling   [x] Joint pain   [] Low back pain Hematologic:  [] Easy bruising  [] Easy bleeding   [] Hypercoagulable state   [] Anemic   Gastrointestinal:  [] Blood in stool   [] Vomiting blood  [] Gastroesophageal reflux/heartburn   [] Abdominal pain Genitourinary:  [] Chronic kidney disease   [] Difficult urination  [] Frequent urination  [] Burning with urination   [] Hematuria Skin:  [] Rashes   [x] Ulcers   [x] Wounds Psychological:  [] History of anxiety   []  History of major depression.  Physical Examination  BP 131/89   Pulse 65   Resp 18   Ht 6\' 1"  (1.854 m)   Wt 189 lb 3.2 oz (85.8 kg)   BMI 24.96 kg/m  Gen:  WD/WN, NAD.  Appears younger than stated age Head: [place]/AT, No temporalis wasting. Ear/Nose/Throat: Hearing grossly intact, nares w/o erythema or drainage Eyes: Conjunctiva clear. Sclera non-icteric Neck: Supple.  Trachea midline Pulmonary:  Good air movement, no use of accessory muscles.  Cardiac: RRR, no JVD Vascular:  Vessel Right Left  Radial Palpable Palpable                          PT Palpable Palpable  DP Palpable Palpable   Gastrointestinal: soft, non-tender/non-distended. No guarding/reflex.  Musculoskeletal: M/S 5/5 throughout.  No deformity or atrophy.  Essentially no lower extremity edema. Neurologic: Sensation grossly intact in extremities.  Symmetrical.  Speech is fluent.  Psychiatric: Judgment intact, Mood & affect appropriate for pt's clinical situation. Dermatologic: No rashes or ulcers noted.  No cellulitis or open wounds.      Labs No results found for this or any previous visit (from the past 2160 hours).  Radiology No results found.  Assessment/Plan  Lymphedema Leg swelling is  essentially gone at this point.  He has lost roughly 50 pounds and that has significantly helped this.  AAA (abdominal aortic aneurysm) without rupture (HCC) Many years status post endovascular repair.  There does appear to be an endoleak on the duplex scan today, but the overall aortic sac diameter has not significantly increased in size.  The iliac size has increased some so we will have to watch this going forward, and if continued growth is seen, repair likely with stent extension down into the external iliac artery and coil embolization of the internal iliac artery on  1 side or another may be necessary.  Popliteal artery aneurysm (HCC) Lower extremity arterial duplex shows a patent stent in the right popliteal artery with stable enlargement around the stent of about 1.3 cm.  His left popliteal artery has increased from 1.15 cm to 1.2 cm but remains biphasic with good flow.  Continue Plavix  and Lipitor.  Continue to follow-up with duplex.   Essential hypertension blood pressure control important in reducing the progression of atherosclerotic disease. On appropriate oral medications.     Hyperlipidemia lipid control important in reducing the progression of atherosclerotic disease. Continue statin therapy  Mikki Alexander, MD  08/22/2023 2:20 PM    This note was created with Dragon medical transcription system.  Any errors from dictation are purely unintentional

## 2023-08-22 NOTE — Assessment & Plan Note (Signed)
 Lower extremity arterial duplex shows a patent stent in the right popliteal artery with stable enlargement around the stent of about 1.3 cm.  His left popliteal artery has increased from 1.15 cm to 1.2 cm but remains biphasic with good flow.  Continue Plavix  and Lipitor.  Continue to follow-up with duplex.

## 2023-09-22 ENCOUNTER — Other Ambulatory Visit: Payer: Self-pay | Admitting: Urology

## 2023-09-22 DIAGNOSIS — C61 Malignant neoplasm of prostate: Secondary | ICD-10-CM

## 2023-10-14 ENCOUNTER — Telehealth (INDEPENDENT_AMBULATORY_CARE_PROVIDER_SITE_OTHER): Payer: Self-pay

## 2023-10-14 NOTE — Telephone Encounter (Signed)
 Patient is scheduled for MRI for prostate cancer. Patient is needing to provide stent information to move forward with MRI. Please Advise

## 2023-10-15 NOTE — Telephone Encounter (Signed)
 Patient has been notified with requesting information

## 2023-12-11 ENCOUNTER — Other Ambulatory Visit

## 2023-12-25 ENCOUNTER — Ambulatory Visit
Admission: RE | Admit: 2023-12-25 | Discharge: 2023-12-25 | Disposition: A | Discharge status: Home / Self Care | Source: Ambulatory Visit | Attending: Urology | Admitting: Urology

## 2023-12-25 DIAGNOSIS — C61 Malignant neoplasm of prostate: Secondary | ICD-10-CM | POA: Diagnosis not present

## 2023-12-25 MED ORDER — GADOPICLENOL 0.5 MMOL/ML IV SOLN
10.0000 mL | Freq: Once | INTRAVENOUS | Status: AC | PRN
  Administered 2023-12-25: 9 mL via INTRAVENOUS

## 2024-08-24 ENCOUNTER — Ambulatory Visit (INDEPENDENT_AMBULATORY_CARE_PROVIDER_SITE_OTHER): Admitting: Vascular Surgery

## 2024-08-24 ENCOUNTER — Other Ambulatory Visit (INDEPENDENT_AMBULATORY_CARE_PROVIDER_SITE_OTHER)

## 2024-08-24 ENCOUNTER — Encounter (INDEPENDENT_AMBULATORY_CARE_PROVIDER_SITE_OTHER)
